# Patient Record
Sex: Female | Born: 1965 | Race: Black or African American | Hispanic: No | Marital: Single | Smoking: Never smoker
Health system: Southern US, Community
[De-identification: ages and names within clinical notes are randomized; demographics above are authoritative.]

## PROBLEM LIST (undated history)

## (undated) DIAGNOSIS — I2510 Atherosclerotic heart disease of native coronary artery without angina pectoris: Secondary | ICD-10-CM

## (undated) DIAGNOSIS — E669 Obesity, unspecified: Secondary | ICD-10-CM

## (undated) DIAGNOSIS — I10 Essential (primary) hypertension: Secondary | ICD-10-CM

## (undated) DIAGNOSIS — I700 Atherosclerosis of aorta: Secondary | ICD-10-CM

## (undated) DIAGNOSIS — N289 Disorder of kidney and ureter, unspecified: Secondary | ICD-10-CM

## (undated) DIAGNOSIS — I517 Cardiomegaly: Secondary | ICD-10-CM

## (undated) DIAGNOSIS — I3139 Other pericardial effusion (noninflammatory): Secondary | ICD-10-CM

## (undated) HISTORY — PX: ADRENAL GLAND SURGERY: SHX544

---

## 2019-10-24 ENCOUNTER — Emergency Department (HOSPITAL_BASED_OUTPATIENT_CLINIC_OR_DEPARTMENT_OTHER)
Admission: EM | Admit: 2019-10-24 | Discharge: 2019-10-24 | Disposition: A | Discharge status: Home / Self Care | Payer: 59 | Attending: Emergency Medicine | Admitting: Emergency Medicine

## 2019-10-24 ENCOUNTER — Emergency Department (HOSPITAL_BASED_OUTPATIENT_CLINIC_OR_DEPARTMENT_OTHER): Payer: 59

## 2019-10-24 ENCOUNTER — Other Ambulatory Visit: Payer: Self-pay

## 2019-10-24 ENCOUNTER — Encounter (HOSPITAL_BASED_OUTPATIENT_CLINIC_OR_DEPARTMENT_OTHER): Payer: Self-pay | Admitting: Emergency Medicine

## 2019-10-24 DIAGNOSIS — Y9389 Activity, other specified: Secondary | ICD-10-CM | POA: Diagnosis not present

## 2019-10-24 DIAGNOSIS — W01198A Fall on same level from slipping, tripping and stumbling with subsequent striking against other object, initial encounter: Secondary | ICD-10-CM | POA: Diagnosis not present

## 2019-10-24 DIAGNOSIS — S92532A Displaced fracture of distal phalanx of left lesser toe(s), initial encounter for closed fracture: Secondary | ICD-10-CM | POA: Insufficient documentation

## 2019-10-24 DIAGNOSIS — S99922A Unspecified injury of left foot, initial encounter: Secondary | ICD-10-CM | POA: Diagnosis present

## 2019-10-24 DIAGNOSIS — Y999 Unspecified external cause status: Secondary | ICD-10-CM | POA: Diagnosis not present

## 2019-10-24 DIAGNOSIS — Y9289 Other specified places as the place of occurrence of the external cause: Secondary | ICD-10-CM | POA: Diagnosis not present

## 2019-10-24 DIAGNOSIS — S92502A Displaced unspecified fracture of left lesser toe(s), initial encounter for closed fracture: Secondary | ICD-10-CM

## 2019-10-24 HISTORY — DX: Essential (primary) hypertension: I10

## 2019-10-24 HISTORY — DX: Disorder of kidney and ureter, unspecified: N28.9

## 2019-10-24 NOTE — ED Triage Notes (Signed)
States she tripped over a shoe on Sunday and has had pain to 3rd toe left foot since then. Bruising noted to toe

## 2019-10-24 NOTE — ED Provider Notes (Signed)
TIME SEEN: 6:29 AM  CHIEF COMPLAINT: left foot pain  HPI: Patient is a 54 year old female with history of hypertension who presents to the emergency department with complaints of left third toe pain and dorsal foot pain after she tripped 3 days ago.  Able to ambulate but has pain with walking.  No numbness or weakness.  No other injury.  Did not fall to the ground, strike her head or lose consciousness.  Has bruising and swelling to the left third toe that is extending into the dorsal midfoot.  ROS: See HPI Constitutional: no fever  Eyes: no drainage  ENT: no runny nose   Cardiovascular:  no chest pain  Resp: no SOB  GI: no vomiting GU: no dysuria Integumentary: no rash  Allergy: no hives  Musculoskeletal: no leg swelling  Neurological: no slurred speech ROS otherwise negative  PAST MEDICAL HISTORY/PAST SURGICAL HISTORY:  Past Medical History:  Diagnosis Date  . Hypertension   . Renal disorder     MEDICATIONS:  Prior to Admission medications   Not on File    ALLERGIES:  Allergies  Allergen Reactions  . Ibuprofen Other (See Comments)    Causes increase in BP    SOCIAL HISTORY:  Social History   Tobacco Use  . Smoking status: Never Smoker  . Smokeless tobacco: Never Used  Substance Use Topics  . Alcohol use: Not Currently    FAMILY HISTORY: No family history on file.  EXAM: BP (!) 168/106 (BP Location: Right Arm)   Pulse 92   Temp 98 F (36.7 C) (Oral)   Resp 18   Ht 5\' 4"  (1.626 m)   Wt 86.2 kg   LMP 10/13/2019   SpO2 97%   BMI 32.61 kg/m  CONSTITUTIONAL: Alert and responds appropriately to questions. Well-appearing; well-nourished HEAD: Normocephalic, atraumatic EYES: Conjunctivae clear, pupils appear equal ENT: normal nose; moist mucous membranes NECK: Normal range of motion CARD: Regular rate and rhythm RESP: Normal chest excursion without splinting or tachypnea; no hypoxia or respiratory distress, speaking full sentences ABD/GI:  non-distended EXT: Normal ROM in all joints, tender to palpation over the third left toe with associated soft tissue swelling and ecchymosis; tender over the dorsal left midfoot without deformity, swelling or ecchymosis; 2+ left DP pulse, no tenderness over the left ankle, no calf tenderness or calf swelling, compartments in the left leg are soft, normal sensation in the left lower extremity SKIN: Normal color for age and race, no rashes on exposed skin NEURO: Moves all extremities equally, normal speech, no facial asymmetry noted PSYCH: The patient's mood and manner are appropriate. Grooming and personal hygiene are appropriate.  MEDICAL DECISION MAKING: Patient here with left foot injury.  Seems mostly isolated to the left third toe but she does have some tenderness extending into the midfoot.  Will obtain x-rays.  She declines any Tylenol, ibuprofen at this time.  No other injury on exam.  Neurovascularly intact distally.  ED PROGRESS: X-ray reviewed/interpreted by myself and appears to show a distal third phalanx fracture.  Will provide postop shoe for comfort but discussed with patient she can weight-bear as tolerated.  Recommend alternating Tylenol and ibuprofen for pain.  Recommended ice, elevation for pain, swelling and bruising.  No other injury seen on x-ray.   At this time, I do not feel there is any life-threatening condition present. I have reviewed, interpreted and discussed all results (EKG, imaging, lab, urine as appropriate) and exam findings with patient/family. I have reviewed nursing notes and appropriate  previous records.  I feel the patient is safe to be discharged home without further emergent workup and can continue workup as an outpatient as needed. Discussed usual and customary return precautions. Patient/family verbalize understanding and are comfortable with this plan.  Outpatient follow-up has been provided as needed. All questions have been answered.  Tracy Decker was  evaluated in Emergency Department on 10/24/2019 for the symptoms described in the history of present illness. She was evaluated in the context of the global COVID-19 pandemic, which necessitated consideration that the patient might be at risk for infection with the SARS-CoV-2 virus that causes COVID-19. Institutional protocols and algorithms that pertain to the evaluation of patients at risk for COVID-19 are in a state of rapid change based on information released by regulatory bodies including the CDC and federal and state organizations. These policies and algorithms were followed during the patient's care in the ED.      Tracy Decker, Tracy Bison, DO 10/24/19 [phone]

## 2019-10-24 NOTE — Discharge Instructions (Addendum)
You may alternate Tylenol 1000 mg every 6 hours as needed for pain and Ibuprofen 800 mg every 8 hours as needed for pain.  Please take Ibuprofen with food.  Do not take more than 4000 mg of Tylenol (acetaminophen) in a 24 hour period.     Steps to find a Primary Care Provider (PCP):  Call [phone] or [phone] to access "[facility] Find a Doctor Service."  2.  You may also go on the [facility] website at [email or web address]  3.  [facility] also frequently accepts new patients.  [facility]  [address] [place] [address] [phone]  4.  There are also multiple [facility], [facility] and [facility] practices throughout the [place] that are frequently accepting new patients. You may find a clinic that is close to your home and contact them.  [email or web address] [phone]  [email or web address]  [email or web address] [phone]  [email or web address] [phone]  5.  Local Health Departments also can provide primary care services.  [facility]  [address] [place] [address] [phone]  [facility] [address] [place] [address] [phone]  [facility] [address]  [place] [address] [phone]   

## 2019-10-25 ENCOUNTER — Telehealth (HOSPITAL_BASED_OUTPATIENT_CLINIC_OR_DEPARTMENT_OTHER): Payer: Self-pay | Admitting: Emergency Medicine

## 2019-11-05 ENCOUNTER — Encounter: Payer: Self-pay | Admitting: Family Medicine

## 2019-11-05 ENCOUNTER — Other Ambulatory Visit: Payer: Self-pay

## 2019-11-05 ENCOUNTER — Ambulatory Visit (INDEPENDENT_AMBULATORY_CARE_PROVIDER_SITE_OTHER): Payer: 59 | Admitting: Family Medicine

## 2019-11-05 VITALS — BP 181/112 | HR 89 | Ht 64.0 in | Wt 190.0 lb

## 2019-11-05 DIAGNOSIS — S92532A Displaced fracture of distal phalanx of left lesser toe(s), initial encounter for closed fracture: Secondary | ICD-10-CM | POA: Diagnosis not present

## 2019-11-05 HISTORY — DX: Displaced fracture of distal phalanx of left lesser toe(s), initial encounter for closed fracture: S92.532A

## 2019-11-05 NOTE — Assessment & Plan Note (Signed)
Initial injury on 5/2. Swelling and pain have improved with post op shoe  -Counseled on buddy taping and supportive care. -Try to transition out of the postop shoe. -Follow-up as needed.

## 2019-11-05 NOTE — Addendum Note (Signed)
Addended by: Rosemarie Ax on: 11/05/2019 12:37 PM   Modules accepted: Level of Service

## 2019-11-05 NOTE — Patient Instructions (Signed)
Nice to meet you Please use ice as needed  Please elevate when possible  Please buddy tape until you are pain free  Please send me a message in [facility] with any questions or updates.  Please see [place] back as needed.   --Dr. Raeford Razor

## 2019-11-05 NOTE — Progress Notes (Signed)
  Tracy Decker - 54 y.o. female MRN [id number]  Date of birth: Apr 21, 1966  SUBJECTIVE:  Including CC & ROS.  Chief Complaint  Patient presents with  . Toe Injury    left 3rd toe    Tracy Decker is a 54 y.o. female that is presenting with left toe pain.  She tripped over a shoe.  She was seen in emergency department and x-ray showed fracture.  She has been using a postop shoe since that time.  Swelling and bruising have gone down..  Independent review of the left foot x-ray from 5/5 shows newly displaced fracture of the volar base of the third distal phalanx    Review of Systems See HPI   HISTORY: Past Medical, Surgical, Social, and Family History Reviewed & Updated per EMR.   Pertinent Historical Findings include:  Past Medical History:  Diagnosis Date  . Hypertension   . Renal disorder     Past Surgical History:  Procedure Laterality Date  . ADRENAL GLAND SURGERY Bilateral     No family history on file.  Social History   Socioeconomic History  . Marital status: Single    Spouse name: Not on file  . Number of children: Not on file  . Years of education: Not on file  . Highest education level: Not on file  Occupational History  . Not on file  Tobacco Use  . Smoking status: Never Smoker  . Smokeless tobacco: Never Used  Substance and Sexual Activity  . Alcohol use: Not Currently  . Drug use: Never  . Sexual activity: Not on file  Other Topics Concern  . Not on file  Social History Narrative  . Not on file   Social Determinants of Health   Financial Resource Strain:   . Difficulty of Paying Living Expenses:   Food Insecurity:   . Worried About Charity fundraiser in the Last Year:   . Arboriculturist in the Last Year:   Transportation Needs:   . Film/video editor (Medical):   Marland Kitchen Lack of Transportation (Non-Medical):   Physical Activity:   . Days of Exercise per Week:   . Minutes of Exercise per Session:   Stress:   . Feeling of Stress :     Social Connections:   . Frequency of Communication with Friends and Family:   . Frequency of Social Gatherings with Friends and Family:   . Attends Religious Services:   . Active Member of Clubs or Organizations:   . Attends Archivist Meetings:   Marland Kitchen Marital Status:   Intimate Partner Violence:   . Fear of Current or Ex-Partner:   . Emotionally Abused:   Marland Kitchen Physically Abused:   . Sexually Abused:      PHYSICAL EXAM:  VS: BP (!) 181/112   Pulse 89   Ht 5\' 4"  (1.626 m)   Wt 190 lb (86.2 kg)   LMP 10/13/2019   BMI 32.61 kg/m  Physical Exam Gen: NAD, alert, cooperative with exam, well-appearing MSK:  Left foot: No ecchymosis. Tenderness to palpation over the distal phalanx of the third phalange. Mild swelling of the third digit. Neurovascularly intact     ASSESSMENT & PLAN:   Closed displaced fracture of distal phalanx of lesser toe of left foot Initial injury on 5/2. Swelling and pain have improved with post op shoe  -Counseled on buddy taping and supportive care. -Try to transition out of the postop shoe. -Follow-up as needed.

## 2020-04-16 ENCOUNTER — Encounter: Payer: Self-pay | Admitting: Family Medicine

## 2020-04-16 ENCOUNTER — Other Ambulatory Visit: Payer: Self-pay

## 2020-04-16 ENCOUNTER — Ambulatory Visit (INDEPENDENT_AMBULATORY_CARE_PROVIDER_SITE_OTHER): Payer: 59 | Admitting: Family Medicine

## 2020-04-16 VITALS — BP 163/97 | HR 71 | Ht 64.0 in | Wt 201.1 lb

## 2020-04-16 DIAGNOSIS — N939 Abnormal uterine and vaginal bleeding, unspecified: Secondary | ICD-10-CM | POA: Diagnosis not present

## 2020-04-16 DIAGNOSIS — D219 Benign neoplasm of connective and other soft tissue, unspecified: Secondary | ICD-10-CM

## 2020-04-16 MED ORDER — MEGESTROL ACETATE 40 MG PO TABS: 40.0000 mg | ORAL_TABLET | Freq: Every day | ORAL | 6 refills | Status: DC

## 2020-04-16 NOTE — Progress Notes (Signed)
Last Pap: 2019 ([place])

## 2020-04-16 NOTE — Progress Notes (Signed)
   Subjective:    Patient ID: Tracy Decker, female    DOB: Apr 28, 1966, 54 y.o.   MRN: [id number]  HPI Patient is a G1P1 here for AUB. She has been having irregular bleeding, occasionally misses a period. Having some episodes of prolonged bleeding. Last period was 9/23 and had continued spotting/bleeding, along with foul odor for about 3 weeks, then stopped. Currently no bleeding.   Has history of depression/anxiety and has been on lexapro and wellbutrin. Is not currently taken them.   She has a history of myomectomy several years ago, due to large fibroids. She reports having an endometrial biopsy in the past.  I have reviewed the patients past medical, family, and social history.  I have reviewed the patient's medication list and allergies.  Review of Systems     Objective:   Physical Exam Vitals reviewed.  Cardiovascular:     Rate and Rhythm: Normal rate and regular rhythm.     Pulses: Normal pulses.     Heart sounds: Normal heart sounds.  Pulmonary:     Effort: Pulmonary effort is normal.     Breath sounds: Normal breath sounds.  Abdominal:     General: Abdomen is flat. There is no distension.     Palpations: Abdomen is soft. There is no mass.     Tenderness: There is no abdominal tenderness. There is no guarding or rebound.     Hernia: No hernia is present.  Skin:    General: Skin is warm.     Capillary Refill: Capillary refill takes less than 2 seconds.  Psychiatric:        Mood and Affect: Mood normal.        Thought Content: Thought content normal.        Judgment: Judgment normal.       Assessment & Plan:  1. Abnormal uterine bleeding (AUB) Check TSH and [place]. Discussed medical vs surgical options - would prefer medical options to start. Will start with oral progesterone as patient declined IUD. Start megace 40mg  daily. Discussed potential side effects. F/u in 2-3 months. - TSH - US PELVIS TRANSVAGINAL NON-OB (TV ONLY); Future

## 2020-04-17 LAB — TSH: TSH: 1.27 u[IU]/mL (ref 0.450–4.500)

## 2020-04-24 ENCOUNTER — Ambulatory Visit (HOSPITAL_BASED_OUTPATIENT_CLINIC_OR_DEPARTMENT_OTHER)
Admission: RE | Admit: 2020-04-24 | Discharge: 2020-04-24 | Disposition: A | Discharge status: Home / Self Care | Payer: 59 | Source: Ambulatory Visit | Attending: Family Medicine | Admitting: Family Medicine

## 2020-04-24 ENCOUNTER — Other Ambulatory Visit: Payer: Self-pay

## 2020-04-24 DIAGNOSIS — N939 Abnormal uterine and vaginal bleeding, unspecified: Secondary | ICD-10-CM | POA: Diagnosis not present

## 2020-05-22 MED ORDER — MEGESTROL ACETATE 40 MG PO TABS
40.0000 mg | ORAL_TABLET | Freq: Two times a day (BID) | ORAL | 3 refills | Status: DC
Start: 2020-05-22 — End: 2020-08-14

## 2020-07-15 ENCOUNTER — Inpatient Hospital Stay (HOSPITAL_COMMUNITY): Payer: No Typology Code available for payment source

## 2020-07-15 ENCOUNTER — Encounter (HOSPITAL_COMMUNITY): Payer: Self-pay | Admitting: Emergency Medicine

## 2020-07-15 ENCOUNTER — Emergency Department (HOSPITAL_COMMUNITY): Payer: No Typology Code available for payment source

## 2020-07-15 ENCOUNTER — Inpatient Hospital Stay (HOSPITAL_COMMUNITY)
Admission: EM | Admit: 2020-07-15 | Discharge: 2020-07-17 | DRG: 313 | Disposition: A | Discharge status: Home / Self Care | Payer: No Typology Code available for payment source | Attending: Internal Medicine | Admitting: Internal Medicine

## 2020-07-15 DIAGNOSIS — I313 Pericardial effusion (noninflammatory): Secondary | ICD-10-CM | POA: Diagnosis present

## 2020-07-15 DIAGNOSIS — R002 Palpitations: Secondary | ICD-10-CM | POA: Diagnosis present

## 2020-07-15 DIAGNOSIS — K219 Gastro-esophageal reflux disease without esophagitis: Secondary | ICD-10-CM | POA: Diagnosis present

## 2020-07-15 DIAGNOSIS — D72829 Elevated white blood cell count, unspecified: Secondary | ICD-10-CM | POA: Diagnosis present

## 2020-07-15 DIAGNOSIS — R6884 Jaw pain: Secondary | ICD-10-CM | POA: Diagnosis present

## 2020-07-15 DIAGNOSIS — E872 Acidosis, unspecified: Secondary | ICD-10-CM

## 2020-07-15 DIAGNOSIS — R000 Tachycardia, unspecified: Secondary | ICD-10-CM | POA: Diagnosis present

## 2020-07-15 DIAGNOSIS — I10 Essential (primary) hypertension: Secondary | ICD-10-CM

## 2020-07-15 DIAGNOSIS — U071 COVID-19: Secondary | ICD-10-CM | POA: Diagnosis present

## 2020-07-15 DIAGNOSIS — M25512 Pain in left shoulder: Secondary | ICD-10-CM | POA: Diagnosis present

## 2020-07-15 DIAGNOSIS — R079 Chest pain, unspecified: Secondary | ICD-10-CM

## 2020-07-15 DIAGNOSIS — E669 Obesity, unspecified: Secondary | ICD-10-CM | POA: Diagnosis present

## 2020-07-15 DIAGNOSIS — Z886 Allergy status to analgesic agent status: Secondary | ICD-10-CM

## 2020-07-15 DIAGNOSIS — R778 Other specified abnormalities of plasma proteins: Secondary | ICD-10-CM

## 2020-07-15 DIAGNOSIS — Z79899 Other long term (current) drug therapy: Secondary | ICD-10-CM | POA: Diagnosis not present

## 2020-07-15 DIAGNOSIS — R0789 Other chest pain: Secondary | ICD-10-CM | POA: Diagnosis present

## 2020-07-15 DIAGNOSIS — Z8616 Personal history of COVID-19: Secondary | ICD-10-CM | POA: Diagnosis not present

## 2020-07-15 DIAGNOSIS — M79602 Pain in left arm: Secondary | ICD-10-CM | POA: Diagnosis present

## 2020-07-15 DIAGNOSIS — Z803 Family history of malignant neoplasm of breast: Secondary | ICD-10-CM

## 2020-07-15 DIAGNOSIS — N289 Disorder of kidney and ureter, unspecified: Secondary | ICD-10-CM | POA: Diagnosis present

## 2020-07-15 DIAGNOSIS — I214 Non-ST elevation (NSTEMI) myocardial infarction: Secondary | ICD-10-CM

## 2020-07-15 DIAGNOSIS — Z6834 Body mass index (BMI) 34.0-34.9, adult: Secondary | ICD-10-CM | POA: Diagnosis not present

## 2020-07-15 HISTORY — DX: Personal history of COVID-19: Z86.16

## 2020-07-15 LAB — CBC WITH DIFFERENTIAL/PLATELET
Abs Immature Granulocytes: 0.03 10*3/uL (ref 0.00–0.07)
Basophils Absolute: 0 10*3/uL (ref 0.0–0.1)
Basophils Relative: 0 %
Eosinophils Absolute: 0.1 10*3/uL (ref 0.0–0.5)
Eosinophils Relative: 1 %
HCT: 40.5 % (ref 36.0–46.0)
Hemoglobin: 13.6 g/dL (ref 12.0–15.0)
Immature Granulocytes: 0 %
Lymphocytes Relative: 31 %
Lymphs Abs: 3.6 10*3/uL (ref 0.7–4.0)
MCH: 30.6 pg (ref 26.0–34.0)
MCHC: 33.6 g/dL (ref 30.0–36.0)
MCV: 91.2 fL (ref 80.0–100.0)
Monocytes Absolute: 1.2 10*3/uL — ABNORMAL HIGH (ref 0.1–1.0)
Monocytes Relative: 10 %
Neutro Abs: 6.6 10*3/uL (ref 1.7–7.7)
Neutrophils Relative %: 58 %
Platelets: 344 10*3/uL (ref 150–400)
RBC: 4.44 MIL/uL (ref 3.87–5.11)
RDW: 13.4 % (ref 11.5–15.5)
WBC: 11.5 10*3/uL — ABNORMAL HIGH (ref 4.0–10.5)
nRBC: 0 % (ref 0.0–0.2)

## 2020-07-15 LAB — LIPID PANEL
Cholesterol: 183 mg/dL (ref 0–200)
HDL: 35 mg/dL — ABNORMAL LOW (ref >40)
LDL Cholesterol: 120 mg/dL — ABNORMAL HIGH (ref 0–99)
Total CHOL/HDL Ratio: 5.2 RATIO
Triglycerides: 141 mg/dL (ref <150)
VLDL: 28 mg/dL (ref 0–40)

## 2020-07-15 LAB — ECHOCARDIOGRAM LIMITED
Area-P 1/2: 4.06 cm2
Height: 64 in
S' Lateral: 2.3 cm
Weight: 3200 oz

## 2020-07-15 LAB — BASIC METABOLIC PANEL
Anion gap: 11 (ref 5–15)
BUN: 14 mg/dL (ref 6–20)
CO2: 19 mmol/L — ABNORMAL LOW (ref 22–32)
Calcium: 9.1 mg/dL (ref 8.9–10.3)
Chloride: 108 mmol/L (ref 98–111)
Creatinine, Ser: 0.96 mg/dL (ref 0.44–1.00)
GFR, Estimated: >60 mL/min (ref >60)
Glucose, Bld: 166 mg/dL — ABNORMAL HIGH (ref 70–99)
Potassium: 4.2 mmol/L (ref 3.5–5.1)
Sodium: 138 mmol/L (ref 135–145)

## 2020-07-15 LAB — COMPREHENSIVE METABOLIC PANEL WITH GFR
ALT: 13 U/L (ref 0–44)
AST: 13 U/L — ABNORMAL LOW (ref 15–41)
Albumin: 3.6 g/dL (ref 3.5–5.0)
Alkaline Phosphatase: 51 U/L (ref 38–126)
Anion gap: 13 (ref 5–15)
BUN: 17 mg/dL (ref 6–20)
CO2: 17 mmol/L — ABNORMAL LOW (ref 22–32)
Calcium: 8.9 mg/dL (ref 8.9–10.3)
Chloride: 109 mmol/L (ref 98–111)
Creatinine, Ser: 1 mg/dL (ref 0.44–1.00)
GFR, Estimated: >60 mL/min
Glucose, Bld: 114 mg/dL — ABNORMAL HIGH (ref 70–99)
Potassium: 3.9 mmol/L (ref 3.5–5.1)
Sodium: 139 mmol/L (ref 135–145)
Total Bilirubin: 0.8 mg/dL (ref 0.3–1.2)
Total Protein: 6.9 g/dL (ref 6.5–8.1)

## 2020-07-15 LAB — I-STAT CHEM 8, ED
BUN: 19 mg/dL (ref 6–20)
Calcium, Ion: 1.17 mmol/L (ref 1.15–1.40)
Chloride: 110 mmol/L (ref 98–111)
Creatinine, Ser: 0.8 mg/dL (ref 0.44–1.00)
Glucose, Bld: 112 mg/dL — ABNORMAL HIGH (ref 70–99)
HCT: 42 % (ref 36.0–46.0)
Hemoglobin: 14.3 g/dL (ref 12.0–15.0)
Potassium: 3.9 mmol/L (ref 3.5–5.1)
Sodium: 142 mmol/L (ref 135–145)
TCO2: 19 mmol/L — ABNORMAL LOW (ref 22–32)

## 2020-07-15 LAB — HEPATIC FUNCTION PANEL
ALT: 12 U/L (ref 0–44)
AST: 19 U/L (ref 15–41)
Albumin: 3.5 g/dL (ref 3.5–5.0)
Alkaline Phosphatase: 57 U/L (ref 38–126)
Bilirubin, Direct: 0.2 mg/dL (ref 0.0–0.2)
Indirect Bilirubin: 0.9 mg/dL (ref 0.3–0.9)
Total Bilirubin: 1.1 mg/dL (ref 0.3–1.2)
Total Protein: 6.6 g/dL (ref 6.5–8.1)

## 2020-07-15 LAB — HEPARIN LEVEL (UNFRACTIONATED)
Heparin Unfractionated: >2.2 IU/mL — ABNORMAL HIGH (ref 0.30–0.70)
Heparin Unfractionated: 0.4 IU/mL (ref 0.30–0.70)
Heparin Unfractionated: 0.4 IU/mL (ref 0.30–0.70)

## 2020-07-15 LAB — CBC
HCT: 39 % (ref 36.0–46.0)
Hemoglobin: 13 g/dL (ref 12.0–15.0)
MCH: 30.6 pg (ref 26.0–34.0)
MCHC: 33.3 g/dL (ref 30.0–36.0)
MCV: 91.8 fL (ref 80.0–100.0)
Platelets: 327 10*3/uL (ref 150–400)
RBC: 4.25 MIL/uL (ref 3.87–5.11)
RDW: 13.5 % (ref 11.5–15.5)
WBC: 10.6 10*3/uL — ABNORMAL HIGH (ref 4.0–10.5)
nRBC: 0 % (ref 0.0–0.2)

## 2020-07-15 LAB — D-DIMER, QUANTITATIVE: D-Dimer, Quant: <0.27 ug/mL-FEU (ref 0.00–0.50)

## 2020-07-15 LAB — TROPONIN I (HIGH SENSITIVITY)
Troponin I (High Sensitivity): 104 ng/L
Troponin I (High Sensitivity): 122 ng/L (ref <18)
Troponin I (High Sensitivity): 153 ng/L (ref <18)
Troponin I (High Sensitivity): 156 ng/L (ref <18)
Troponin I (High Sensitivity): 149 ng/L (ref <18)

## 2020-07-15 LAB — TSH: TSH: 1.994 u[IU]/mL (ref 0.350–4.500)

## 2020-07-15 LAB — SARS CORONAVIRUS 2 BY RT PCR (HOSPITAL ORDER, PERFORMED IN ~~LOC~~ HOSPITAL LAB): SARS Coronavirus 2: POSITIVE — AB

## 2020-07-15 LAB — I-STAT BETA HCG BLOOD, ED (MC, WL, AP ONLY): I-stat hCG, quantitative: <5 m[IU]/mL

## 2020-07-15 LAB — SEDIMENTATION RATE: Sed Rate: 33 mm/hr — ABNORMAL HIGH (ref 0–22)

## 2020-07-15 LAB — HIV ANTIBODY (ROUTINE TESTING W REFLEX): HIV Screen 4th Generation wRfx: NONREACTIVE

## 2020-07-15 LAB — C-REACTIVE PROTEIN: CRP: <0.5 mg/dL (ref <1.0)

## 2020-07-15 MED ORDER — ONDANSETRON HCL 4 MG/2ML IJ SOLN
4.0000 mg | Freq: Once | INTRAMUSCULAR | Status: AC
  Administered 2020-07-15: 4 mg via INTRAVENOUS
  Filled 2020-07-15: qty 2

## 2020-07-15 MED ORDER — HEPARIN BOLUS VIA INFUSION
4000.0000 [IU] | Freq: Once | INTRAVENOUS | Status: AC
  Administered 2020-07-15: 4000 [IU] via INTRAVENOUS
  Filled 2020-07-15: qty 4000

## 2020-07-15 MED ORDER — LISINOPRIL 20 MG PO TABS
40.0000 mg | ORAL_TABLET | Freq: Every day | ORAL | Status: DC
Start: 2020-07-15 — End: 2020-07-17
  Administered 2020-07-15 – 2020-07-17 (×3): 40 mg via ORAL
  Filled 2020-07-15 (×3): qty 2

## 2020-07-15 MED ORDER — ACETAMINOPHEN 325 MG PO TABS
650.0000 mg | ORAL_TABLET | ORAL | Status: DC | PRN
  Administered 2020-07-15 (×3): 650 mg via ORAL
  Filled 2020-07-15 (×3): qty 2

## 2020-07-15 MED ORDER — IOHEXOL 350 MG/ML SOLN
100.0000 mL | Freq: Once | INTRAVENOUS | Status: AC | PRN
  Administered 2020-07-15: 100 mL via INTRAVENOUS

## 2020-07-15 MED ORDER — ROSUVASTATIN CALCIUM 20 MG PO TABS
40.0000 mg | ORAL_TABLET | Freq: Every day | ORAL | Status: DC
  Administered 2020-07-15 – 2020-07-17 (×3): 40 mg via ORAL
  Filled 2020-07-15 (×3): qty 2

## 2020-07-15 MED ORDER — ONDANSETRON HCL 4 MG/2ML IJ SOLN: 4.0000 mg | Freq: Four times a day (QID) | INTRAMUSCULAR | Status: DC | PRN

## 2020-07-15 MED ORDER — METOPROLOL TARTRATE 12.5 MG HALF TABLET
12.5000 mg | ORAL_TABLET | Freq: Two times a day (BID) | ORAL | Status: DC
  Administered 2020-07-15 (×2): 12.5 mg via ORAL
  Filled 2020-07-15 (×3): qty 1

## 2020-07-15 MED ORDER — MORPHINE SULFATE (PF) 2 MG/ML IV SOLN
1.0000 mg | INTRAVENOUS | Status: DC | PRN
  Administered 2020-07-15: 1 mg via INTRAVENOUS
  Filled 2020-07-15: qty 1

## 2020-07-15 MED ORDER — ASPIRIN EC 81 MG PO TBEC
81.0000 mg | DELAYED_RELEASE_TABLET | Freq: Every day | ORAL | Status: DC
  Administered 2020-07-16 – 2020-07-17 (×2): 81 mg via ORAL
  Filled 2020-07-15 (×2): qty 1

## 2020-07-15 MED ORDER — MEGESTROL ACETATE 40 MG PO TABS
40.0000 mg | ORAL_TABLET | Freq: Two times a day (BID) | ORAL | Status: DC
  Administered 2020-07-15 – 2020-07-17 (×5): 40 mg via ORAL
  Filled 2020-07-15 (×7): qty 1

## 2020-07-15 MED ORDER — HEPARIN (PORCINE) 25000 UT/250ML-% IV SOLN
1100.0000 [IU]/h | INTRAVENOUS | Status: DC
  Administered 2020-07-15 (×2): 1100 [IU]/h via INTRAVENOUS
  Filled 2020-07-15 (×3): qty 250

## 2020-07-15 MED ORDER — SODIUM CHLORIDE 0.9 % IV SOLN: INTRAVENOUS | Status: DC

## 2020-07-15 MED ORDER — ASPIRIN 325 MG PO TABS
325.0000 mg | ORAL_TABLET | Freq: Every day | ORAL | Status: DC
  Administered 2020-07-15: 325 mg via ORAL
  Filled 2020-07-15: qty 1

## 2020-07-15 MED ORDER — SODIUM CHLORIDE 0.9 % IV BOLUS
500.0000 mL | Freq: Once | INTRAVENOUS | Status: AC
  Administered 2020-07-15: 500 mL via INTRAVENOUS

## 2020-07-15 MED ORDER — NITROGLYCERIN IN D5W 200-5 MCG/ML-% IV SOLN
0.0000 ug/min | INTRAVENOUS | Status: DC
  Administered 2020-07-15: 30 ug/min via INTRAVENOUS
  Administered 2020-07-15: 15 ug/min via INTRAVENOUS
  Administered 2020-07-15: 40 ug/min via INTRAVENOUS
  Administered 2020-07-15: 7 ug/min via INTRAVENOUS
  Administered 2020-07-15: 10 ug/min via INTRAVENOUS
  Administered 2020-07-15: 20 ug/min via INTRAVENOUS
  Administered 2020-07-15: 25 ug/min via INTRAVENOUS
  Administered 2020-07-15: 10 ug/min via INTRAVENOUS
  Administered 2020-07-15: 15 ug/min via INTRAVENOUS
  Filled 2020-07-15 (×2): qty 250

## 2020-07-15 NOTE — Progress Notes (Signed)
Brief cardiology update: Echo without wall motion abnormalities, small effusion. Inflammatory markers not significantly elevated. Called into patient's room to discussion next steps in evaluation. Discussed risk/benefit, will plan for CT cardiac (time TBD, likely tomorrow AM). She is already on metoprolol and nitroglycerin. She is amenable, understands risk of radiation and contrast exposure (recent CTPE noted). Orders placed. Buford Dresser, MD, PhD, [facility]  [address], [place], [place] [address] [phone]

## 2020-07-15 NOTE — Consult Note (Signed)
[facility] Consult Note   Primary Physician: [name], NP Primary Cardiologist:  None listed  Reason for Consultation: Chest pain  HPI:    Tracy Decker is a 55 year old African-American female with a past medical history significant for recent COVID-19 infection (10 days ago - with minor symptoms), hypertension, and obesity, who presents to the hospital with complaints of acute onset of chest pain earlier in the evening.  Patient reported that the chest pain woke her up from sleep.  It was left-sided with left arm and jaw pain.  It was 9 out of 10 in intensity.  She also reported a subjective sensation of her heart beating fast.  There was no nausea, vomiting or  diaphoresis.  EMS treated her with 500 cc of normal saline, aspirin, sublingual nitroglycerin and 8 mg of morphine.  In the ED the chest pain was much improved.  She was still noted to be tachycardic in the 110s.  High-sensitivity troponins were 104 and 122.  Electrocardiogram showed sinus tachycardia, rate 118 bpm, voltage criteria for LVH and ST-T abnormalities in the inferolateral leads  Chest x-ray showing no active disease. CT angiogram chest negative for PE.  Showing moderate LVH.  Tiny focus of subpleural groundglass pulmonary infiltrate within the right lower lobe (nonspecific).    Home Medications Prior to Admission medications   Medication Sig Start Date End Date Taking? Authorizing Provider  amLODipine (NORVASC) 10 MG tablet Take 10 mg by mouth daily.   Yes [provider]  Cholecalciferol (VITAMIN D3) 50 MCG (2000 UT) CAPS Take 2,000 Units by mouth daily.   Yes [provider]  Fluocinolone Acetonide Scalp 0.01 % OIL Apply 1 application topically daily as needed for irritation. 02/01/20  Yes [provider]  hydrochlorothiazide (MICROZIDE) 12.5 MG capsule Take 12.5 mg by mouth daily. 06/16/20  Yes [provider]  lisinopril (ZESTRIL) 40 MG tablet Take 40 mg by mouth  daily.   Yes [provider]  megestrol (MEGACE) 40 MG tablet Take 1 tablet (40 mg total) by mouth 2 (two) times daily. Can increase to two tablets twice a day in the event of heavy bleeding 05/22/20  Yes Truett Mainland, DO  metoprolol tartrate (LOPRESSOR) 25 MG tablet Take 25 mg by mouth at bedtime.   Yes [provider]  minoxidil (LONITEN) 2.5 MG tablet Take 5 mg by mouth daily.   Yes [provider]  spironolactone (ALDACTONE) 50 MG tablet Take 50 mg by mouth daily.   Yes [provider]  zolpidem (AMBIEN) 10 MG tablet Take 10 mg by mouth at bedtime as needed for sleep. 07/09/20  Yes [provider]    Past Medical History: Past Medical History:  Diagnosis Date  . Hypertension   . Renal disorder     Past Surgical History: Past Surgical History:  Procedure Laterality Date  . ADRENAL GLAND SURGERY Bilateral     Family History: Family History  Problem Relation Age of Onset  . Breast cancer Maternal Grandmother     Social History: Social History   Socioeconomic History  . Marital status: Single    Spouse name: Not on file  . Number of children: Not on file  . Years of education: Not on file  . Highest education level: Not on file  Occupational History  . Not on file  Tobacco Use  . Smoking status: Never Smoker  . Smokeless tobacco: Never Used  Substance and Sexual Activity  . Alcohol use: Not  Currently  . Drug use: Never  . Sexual activity: Not Currently    Birth control/protection: None  Other Topics Concern  . Not on file  Social History Narrative  . Not on file   Social Determinants of Health   Financial Resource Strain: Not on file  Food Insecurity: Not on file  Transportation Needs: Not on file  Physical Activity: Not on file  Stress: Not on file  Social Connections: Not on file    Allergies:  Allergies  Allergen Reactions  . Ibuprofen Other (See Comments)    Causes increase in BP     Review of  Systems: [y] = yes, [ ]  = no   . General: Weight gain [ ] ; Weight loss [ ] ; Anorexia [ ] ; Fatigue [ ] ; Fever [ ] ; Chills [ ] ; Weakness [ ]   . Cardiac: Chest pain/pressure [Y]; Resting SOB [ ] ; Exertional SOB [ ] ; Orthopnea [ ] ; Pedal Edema [ ] ; Palpitations [Y]; Syncope [ ] ; Presyncope [ ] ; Paroxysmal nocturnal dyspnea[ ]   . Pulmonary: Cough [ ] ; Wheezing[ ] ; Hemoptysis[ ] ; Sputum [ ] ; Snoring [ ]   . GI: Vomiting[ ] ; Dysphagia[ ] ; Melena[ ] ; Hematochezia [ ] ; Heartburn[ ] ; Abdominal pain [ ] ; Constipation [ ] ; Diarrhea [ ] ; BRBPR [ ]   . GU: Hematuria[ ] ; Dysuria [ ] ; Nocturia[ ]   . Vascular: Pain in legs with walking [ ] ; Pain in feet with lying flat [ ] ; Non-healing sores [ ] ; Stroke [ ] ; TIA [ ] ; Slurred speech [ ] ;  . Neuro: Headaches[ ] ; Vertigo[ ] ; Seizures[ ] ; Paresthesias[ ] ;Blurred vision [ ] ; Diplopia [ ] ; Vision changes [ ]   . Ortho/Skin: Arthritis [ ] ; Joint pain [ ] ; Muscle pain [ ] ; Joint swelling [ ] ; Back Pain [ ] ; Rash [ ]   . Psych: Depression[ ] ; Anxiety[ ]   . Heme: Bleeding problems [ ] ; Clotting disorders [ ] ; Anemia [ ]   . Endocrine: Diabetes [ ] ; Thyroid dysfunction[ ]      Objective:    Vital Signs:   Temp:  [98.1 F (36.7 C)] 98.1 F (36.7 C) (01/25 0005) Pulse Rate:  [110-125] 110 (01/25 0045) Resp:  [12-18] 17 (01/25 0045) BP: (139-147)/(88-101) 139/88 (01/25 0045) SpO2:  [97 %-98 %] 97 % (01/25 0045) Weight:  [90.7 kg] 90.7 kg (01/25 0010)    Weight change: Filed Weights   07/15/20 0010  Weight: 90.7 kg    Intake/Output:  No intake or output data in the 24 hours ending 07/15/20 0314    Physical Exam    General:  Well appearing. No resp difficulty HEENT: normal Neck: supple. JVP . Carotids 2+ bilat; no bruits. No lymphadenopathy or thyromegaly appreciated. Cor: Tachycardic, no murmurs Lungs: clear Abdomen: soft, nontender, nondistended. No hepatosplenomegaly. No bruits or masses. Good bowel sounds. Extremities: no cyanosis, clubbing, rash,  edema Neuro: alert & orientedx3, cranial nerves grossly intact. moves all 4 extremities w/o difficulty. Affect pleasant    Labs   Basic Metabolic Panel: Recent Labs  Lab 07/15/20 0020 07/15/20 0026  NA 139 142  K 3.9 3.9  CL 109 110  CO2 17*  --   GLUCOSE 114* 112*  BUN 17 19  CREATININE 1.00 0.80  CALCIUM 8.9  --     Liver Function Tests: Recent Labs  Lab 07/15/20 0020  AST 13*  ALT 13  ALKPHOS 51  BILITOT 0.8  PROT 6.9  ALBUMIN 3.6   No results for input(s): LIPASE, AMYLASE in the last 168 hours. No results for  input(s): AMMONIA in the last 168 hours.  CBC: Recent Labs  Lab 07/15/20 0020 07/15/20 0026  WBC 11.5*  --   NEUTROABS 6.6  --   HGB 13.6 14.3  HCT 40.5 42.0  MCV 91.2  --   PLT 344  --     Cardiac Enzymes: No results for input(s): CKTOTAL, CKMB, CKMBINDEX, TROPONINI in the last 168 hours.  BNP: BNP (last 3 results) No results for input(s): BNP in the last 8760 hours.  ProBNP (last 3 results) No results for input(s): PROBNP in the last 8760 hours.   CBG: No results for input(s): GLUCAP in the last 168 hours.  Coagulation Studies: No results for input(s): LABPROT, INR in the last 72 hours.   Imaging   CT Angio Chest PE W and/or Wo Contrast  Result Date: 07/15/2020 CLINICAL DATA:  Chest pain, dyspnea EXAM: CT ANGIOGRAPHY CHEST WITH CONTRAST TECHNIQUE: Multidetector CT imaging of the chest was performed using the standard protocol during bolus administration of intravenous contrast. Multiplanar CT image reconstructions and MIPs were obtained to evaluate the vascular anatomy. CONTRAST:  140mL OMNIPAQUE IOHEXOL 350 MG/ML SOLN COMPARISON:  None. FINDINGS: Cardiovascular: There is excellent opacification of the a pulmonary arterial tree. No intraluminal filling defect identified to suggest acute pulmonary embolism. The central pulmonary arteries are of normal caliber. Global cardiac size is within normal limits. Moderate hypertrophy of the left  ventricle, however, is noted. Trace pericardial effusion is present. The thoracic aorta is unremarkable. Bovine arch anatomy noted. Mediastinum/Nodes: Thyroid unremarkable. No pathologic thoracic adenopathy. Esophagus unremarkable. Lungs/Pleura: The lungs are symmetrically well expanded. There is a focal area of subpleural ground-glass pulmonary infiltrate within the a posterobasal right lower lobe, best seen on axial image # 96/6 which is nonspecific, but may represent the sequela of subacute trauma, remote infarction, or subacute to remote infection. The lungs are otherwise clear. No pneumothorax or pleural effusion. The central airways are widely patent. Upper Abdomen: Unremarkable Musculoskeletal: No acute bone abnormality. Review of the MIP images confirms the above findings. IMPRESSION: No pulmonary embolism. Moderate left ventricular hypertrophy. This could be better assessed with echocardiography. Tiny focus of subpleural ground-glass pulmonary infiltrate within the right lower lobe, nonspecific. Differential considerations are as listed above. Electronically Signed   By: Fidela Salisbury MD   On: 07/15/2020 01:16   DG Chest Portable 1 View  Result Date: 07/15/2020 CLINICAL DATA:  Chest pain EXAM: PORTABLE CHEST 1 VIEW COMPARISON:  None. FINDINGS: The heart size and mediastinal contours are within normal limits. Both lungs are clear. The visualized skeletal structures are unremarkable. IMPRESSION: No active disease. Electronically Signed   By: Ulyses Jarred M.D.   On: 07/15/2020 00:38      Medications:     Current Medications: . megestrol  40 mg Oral BID     Infusions: . sodium chloride    . heparin 1,100 Units/hr (07/15/20 0229)  . nitroGLYCERIN 7 mcg/min (07/15/20 0224)       Assessment/Plan   1. Chest pain Patient recently tested positive for a COVID-19 infection 10 days ago.  She had only mild symptoms and her cough is now resolved.  She presents to the hospital with acute  onset of severe left-sided chest pain.  She also is noted to have persistent sinus tachycardia.  CT of the chest is negative for a pulmonary embolism.  The high-sensitivity troponins are flat (104 and 122) which is not consistent with an ACS.  Viral myocarditis is certainly on the differential.  The  electrocardiogram shows significant LVH with repolarization abnormalities.  -Serial ECGs -Continue to trend cardiac enzymes -Aspirin 81 mg daily -Intravenous unfractionated heparin infusion -High intensity statins -Check a lipid panel -Echocardiogram to evaluate LV function  Thank you for involving [place] in the care of your patient.  We will leave further recommendations once more objective data is available for review.  If you have any questions or concerns please do not hesitate to contact us.   Meade Maw, MD  07/15/2020, 3:14 AM  Cardiology Overnight Team Please contact [facility] for night-coverage after hours (4p -7a ) and weekends on [email or web address]

## 2020-07-15 NOTE — Progress Notes (Signed)
  Echocardiogram 2D Echocardiogram has been performed.  Tracy Decker 07/15/2020, 9:50 AM

## 2020-07-15 NOTE — ED Notes (Signed)
Made Dr. Marlowe Sax aware of pts monitor continuing to alarm "ST elevation". Ekg repeated for comparison. Nitro drip titrated for Better BP control per parameters. Pt denies any change in pain with Nitro increase. States "pain comes and goes, nothing like it was"

## 2020-07-15 NOTE — Progress Notes (Signed)
ANTICOAGULATION CONSULT NOTE - Initial Consult  Pharmacy Consult for heparin Indication: chest pain/ACS  Allergies  Allergen Reactions  . Ibuprofen Other (See Comments)    Causes increase in BP    Patient Measurements: Height: 5\' 4"  (162.6 cm) Weight: 90.7 kg (200 lb) IBW/kg (Calculated) : 54.7 Heparin Dosing Weight: 75.1 kg  Vital Signs: Temp: 98.1 F (36.7 C) (01/25 0005) Temp Source: Oral (01/25 0005) BP: 139/88 (01/25 0045) Pulse Rate: 110 (01/25 0045)  Labs: Recent Labs    07/15/20 0020 07/15/20 0026  HGB 13.6 14.3  HCT 40.5 42.0  PLT 344  --   CREATININE 1.00 0.80  TROPONINIHS 104*  --     Estimated Creatinine Clearance: 88.7 mL/min (by C-G formula based on SCr of 0.8 mg/dL).   Medical History: Past Medical History:  Diagnosis Date  . Hypertension   . Renal disorder     Medications:  See medication history  Assessment: 55 yo lady to start heparin for CP.  She was not on anticoagulation PTA.  Hg 14.3, PTLC 344 Goal of Therapy:  Heparin level 0.3-0.7 units/ml Monitor platelets by anticoagulation protocol: Yes   Plan:  Heparin bolus 4000 units and drip at 1100 units/hr Check heparin level 6-8 hours after start Daily HL and CBC while on heparin Monitor for bleeding complications  Tracy Decker 07/15/2020,1:48 AM

## 2020-07-15 NOTE — ED Triage Notes (Signed)
Assume care from EMS, ems reports chest pain starting at 1030pm radiating to left side of neck and shoulder. EMS administer 500cc of NS, 324 of Asprin, 3 sublingual of Nitro and 8 mg of morphine with mild relief of s/s. EMS states pt has an hx of HTN and had covid 2 weeks ago   160/110 HR 130 02 98 RA  CBG 134  Temp 98.0 RR 24

## 2020-07-15 NOTE — Progress Notes (Signed)
ANTICOAGULATION CONSULT NOTE - Initial Consult  Pharmacy Consult for heparin Indication: chest pain/ACS  Allergies  Allergen Reactions  . Ibuprofen Other (See Comments)    Causes increase in BP    Patient Measurements: Height: 5\' 4"  (162.6 cm) Weight: 90.7 kg (200 lb) IBW/kg (Calculated) : 54.7 Heparin Dosing Weight: 75.1 kg  Vital Signs: Temp: 98.9 F (37.2 C) (01/25 0418) Temp Source: Oral (01/25 0418) BP: 146/91 (01/25 1000) Pulse Rate: 88 (01/25 1000)  Labs: Recent Labs    07/15/20 0020 07/15/20 0026 07/15/20 0207 07/15/20 0439 07/15/20 0652 07/15/20 0850  HGB 13.6 14.3  --  13.0  --   --   HCT 40.5 42.0  --  39.0  --   --   PLT 344  --   --  327  --   --   HEPARINUNFRC  --   --   --   --   --  >2.20*  CREATININE 1.00 0.80  --  0.96  --   --   TROPONINIHS 104*  --  122*  --  153* 156*    Estimated Creatinine Clearance: 73.9 mL/min (by C-G formula based on SCr of 0.96 mg/dL).   Medical History: Past Medical History:  Diagnosis Date  . Hypertension   . Renal disorder     Medications:  See medication history  Assessment: 65 yof on IV heparin for chest pain. She was not on anticoagulation PTA.  Currently on heparin infusion at 1100 units/hr. Initial HL was >2.2 but this level was drawn from the arm that heparin was infusing. A stat repeat check from opposite arm was therapeutic at 0.4. H/H and Plt wnl.   Goal of Therapy:  Heparin level 0.3-0.7 units/ml Monitor platelets by anticoagulation protocol: Yes   Plan:  Continue IV heparin at 1100 units/hr  Daily HL and CBC while on heparin Monitor for bleeding complications  Albertina Parr, PharmD., BCPS, BCCCP Clinical Pharmacist Please refer to [facility] for unit-specific pharmacist

## 2020-07-15 NOTE — Progress Notes (Signed)
PROGRESS NOTE    Tracy Decker  HUD:[id number] DOB: [birth year] DOA: 07/15/2020 PCP: [name], NP   Brief Narrative: 55 year old with past medical history significant for hypertension, obesity BMI 34% complaining of chest pain.  Patient reports that she wake up with chest pain, severe 9 out of 10 sharp in quality.  Pain radiated to her jaw and left arm.  She felt amputation. She tested positive for Covid 10 days prior to admission.  She had mild symptoms when she was diagnosed with Covid, sore throat and sinus.  Evaluation in the ED she was tachycardic, EKG showing some ST changes lateral lead, chest x-ray no active disease. CT angio chest negative for PE Cardiology was consulted due to concern for myopericarditis.   Assessment & Plan:   Principal Problem:   Chest pain Active Problems:   Sinus tachycardia   History of CHYIF-02   Metabolic acidosis   Essential hypertension   1-Chest pain, elevation of troponin Could be Related to myopericarditis. Cardiology with less concern for acute coronary syndrome Currently on IV nitroglycerin and heparin drip IV morphine ordered this morning due to recurrence of left arm pain and jaw pain,.  Started on aspirin and statins.   Sinus tachycardia: Resume metoprolol CT angio negative for PE TSH normal.   Recent COVID-19 viral infection: She tested positive for 10 days as an outpatient facility. He had mild symptoms. No indication for treatment at this time. She is out of the window for isolation precaution PCR still positive.   mild leukocytosis: Follow trend  Normal anion gap metabolic acidosis: Related to use of diuretics. NSL fluids.   Hypertension: Nitroglycerin drip, resume metoprolol and lisinopril.    Estimated body mass index is 34.33 kg/m as calculated from the following:   Height as of this encounter: 5\' 4"  (1.626 m).   Weight as of this encounter: 90.7 kg.   DVT prophylaxis: Heparin  Code Status: Full  code Family Communication:n patient and mother who was at bedside.  Disposition Plan:  Status is: Inpatient  Remains inpatient appropriate because:Ongoing diagnostic testing needed not appropriate for outpatient work up   Dispo: The patient is from: Home              Anticipated d/c is to: Home              Anticipated d/c date is: 2 days              Patient currently is not medically stable to d/c.   Difficult to place patient No        Consultants:   Cardiology   Procedures:   ECHO; Ef 75 %  Antimicrobials:    Subjective: She is complaining of mild headaches.  She started to have left arm pain and jaw pain.    Objective: Vitals:   07/15/20 0630 07/15/20 0700 07/15/20 0800 07/15/20 0835  BP: (!) 146/101 (!) 149/95 (!) 147/92 (!) 153/93  Pulse: (!) 101 95 91 94  Resp: 12 17 17 15   Temp:      TempSrc:      SpO2: 100% 99% 99% 99%  Weight:      Height:       No intake or output data in the 24 hours ending 07/15/20 0843 Filed Weights   07/15/20 0010  Weight: 90.7 kg    Examination:  General exam: Appears calm and comfortable  Respiratory system: Clear to auscultation. Respiratory effort normal. Cardiovascular system: S1 & S2 heard, RRR. No JVD,  murmurs, rubs, gallops or clicks. No pedal edema. Gastrointestinal system: Abdomen is nondistended, soft and nontender. No organomegaly or masses felt. Normal bowel sounds heard. Central nervous system: Alert and oriented. No focal neurological deficits. Extremities: Symmetric 5 x 5 power.   Data Reviewed: I have personally reviewed following labs and imaging studies  CBC: Recent Labs  Lab 07/15/20 0020 07/15/20 0026 07/15/20 0439  WBC 11.5*  --  10.6*  NEUTROABS 6.6  --   --   HGB 13.6 14.3 13.0  HCT 40.5 42.0 39.0  MCV 91.2  --  91.8  PLT 344  --  829   Basic Metabolic Panel: Recent Labs  Lab 07/15/20 0020 07/15/20 0026 07/15/20 0439  NA 139 142 138  K 3.9 3.9 4.2  CL 109 110 108  CO2 17*   --  19*  GLUCOSE 114* 112* 166*  BUN 17 19 14   CREATININE 1.00 0.80 0.96  CALCIUM 8.9  --  9.1   GFR: Estimated Creatinine Clearance: 73.9 mL/min (by C-G formula based on SCr of 0.96 mg/dL). Liver Function Tests: Recent Labs  Lab 07/15/20 0020  AST 13*  ALT 13  ALKPHOS 51  BILITOT 0.8  PROT 6.9  ALBUMIN 3.6   No results for input(s): LIPASE, AMYLASE in the last 168 hours. No results for input(s): AMMONIA in the last 168 hours. Coagulation Profile: No results for input(s): INR, PROTIME in the last 168 hours. Cardiac Enzymes: No results for input(s): CKTOTAL, CKMB, CKMBINDEX, TROPONINI in the last 168 hours. BNP (last 3 results) No results for input(s): PROBNP in the last 8760 hours. HbA1C: No results for input(s): HGBA1C in the last 72 hours. CBG: No results for input(s): GLUCAP in the last 168 hours. Lipid Profile: No results for input(s): CHOL, HDL, LDLCALC, TRIG, CHOLHDL, LDLDIRECT in the last 72 hours. Thyroid Function Tests: Recent Labs    07/15/20 0439  TSH 1.994   Anemia Panel: No results for input(s): VITAMINB12, FOLATE, FERRITIN, TIBC, IRON, RETICCTPCT in the last 72 hours. Sepsis Labs: No results for input(s): PROCALCITON, LATICACIDVEN in the last 168 hours.  Recent Results (from the past 240 hour(s))  SARS Coronavirus 2 by RT PCR (hospital order, performed in [facility] hospital lab) Nasopharyngeal Nasopharyngeal Swab     Status: Abnormal   Collection Time: 07/15/20  2:31 AM   Specimen: Nasopharyngeal Swab  Result Value Ref Range Status   SARS Coronavirus 2 POSITIVE (A) NEGATIVE Final    Comment: RESULT CALLED TO, READ BACK BY AND VERIFIED WITH: Nolon Stalls 0350 07/15/2020 T. TYSOR (NOTE) SARS-CoV-2 target nucleic acids are DETECTED  SARS-CoV-2 RNA is generally detectable in upper respiratory specimens  during the acute phase of infection.  Positive results are indicative  of the presence of the identified virus, but do not rule out bacterial  infection or co-infection with other pathogens not detected by the test.  Clinical correlation with patient history and  other diagnostic information is necessary to determine patient infection status.  The expected result is negative.  Fact Sheet for Patients:   StrictlyIdeas.no   Fact Sheet for Healthcare Providers:   BankingDealers.co.za    This test is not yet approved or cleared by the [place] FDA and  has been authorized for detection and/or diagnosis of SARS-CoV-2 by FDA under an Emergency Use Authorization (EUA).  This EUA will remain in effect (meaning this  test can be used) for the duration of  the COVID-19 declaration under Section 564(b)(1) of the Act, 21 U.S.C.  section 360-bbb-3(b)(1), unless the authorization is terminated or revoked sooner.  Performed at [facility], [place] [address], [place], [place] [address]          Radiology Studies: CT Angio Chest PE W and/or Wo Contrast  Result Date: 07/15/2020 CLINICAL DATA:  Chest pain, dyspnea EXAM: CT ANGIOGRAPHY CHEST WITH CONTRAST TECHNIQUE: Multidetector CT imaging of the chest was performed using the standard protocol during bolus administration of intravenous contrast. Multiplanar CT image reconstructions and MIPs were obtained to evaluate the vascular anatomy. CONTRAST:  181mL OMNIPAQUE IOHEXOL 350 MG/ML SOLN COMPARISON:  None. FINDINGS: Cardiovascular: There is excellent opacification of the a pulmonary arterial tree. No intraluminal filling defect identified to suggest acute pulmonary embolism. The central pulmonary arteries are of normal caliber. Global cardiac size is within normal limits. Moderate hypertrophy of the left ventricle, however, is noted. Trace pericardial effusion is present. The thoracic aorta is unremarkable. Bovine arch anatomy noted. Mediastinum/Nodes: Thyroid unremarkable. No pathologic thoracic adenopathy. Esophagus unremarkable.  Lungs/Pleura: The lungs are symmetrically well expanded. There is a focal area of subpleural ground-glass pulmonary infiltrate within the a posterobasal right lower lobe, best seen on axial image # 96/6 which is nonspecific, but may represent the sequela of subacute trauma, remote infarction, or subacute to remote infection. The lungs are otherwise clear. No pneumothorax or pleural effusion. The central airways are widely patent. Upper Abdomen: Unremarkable Musculoskeletal: No acute bone abnormality. Review of the MIP images confirms the above findings. IMPRESSION: No pulmonary embolism. Moderate left ventricular hypertrophy. This could be better assessed with echocardiography. Tiny focus of subpleural ground-glass pulmonary infiltrate within the right lower lobe, nonspecific. Differential considerations are as listed above. Electronically Signed   By: Fidela Salisbury MD   On: 07/15/2020 01:16   DG Chest Portable 1 View  Result Date: 07/15/2020 CLINICAL DATA:  Chest pain EXAM: PORTABLE CHEST 1 VIEW COMPARISON:  None. FINDINGS: The heart size and mediastinal contours are within normal limits. Both lungs are clear. The visualized skeletal structures are unremarkable. IMPRESSION: No active disease. Electronically Signed   By: Ulyses Jarred M.D.   On: 07/15/2020 00:38        Scheduled Meds: . megestrol  40 mg Oral BID  . metoprolol tartrate  12.5 mg Oral BID  . rosuvastatin  40 mg Oral Daily   Continuous Infusions: . heparin 1,100 Units/hr (07/15/20 0229)  . nitroGLYCERIN 30 mcg/min (07/15/20 0821)     LOS: 0 days    Time spent: 35 minutes    Rhegan Trunnell A Tika Hannis, MD [facility]   If 7PM-7AM, please contact night-coverage [email or web address]  07/15/2020, 8:43 AM

## 2020-07-15 NOTE — ED Notes (Signed)
Stat Troponin sent to lab at [phone]

## 2020-07-15 NOTE — Progress Notes (Signed)
ANTICOAGULATION CONSULT NOTE  Pharmacy Consult:  Heparin Indication: chest pain/ACS  Allergies  Allergen Reactions  . Ibuprofen Other (See Comments)    Causes increase in BP    Patient Measurements: Height: 5\' 4"  (162.6 cm) Weight: 90.7 kg (200 lb) IBW/kg (Calculated) : 54.7 Heparin Dosing Weight: 75.1 kg  Vital Signs: Temp: 98.7 F (37.1 C) (01/25 2023) Temp Source: Oral (01/25 2023) BP: 161/96 (01/25 2023) Pulse Rate: 84 (01/25 2023)  Labs: Recent Labs    07/15/20 0020 07/15/20 0026 07/15/20 0207 07/15/20 0439 07/15/20 6644 07/15/20 0850 07/15/20 1054 07/15/20 1350 07/15/20 2057  HGB 13.6 14.3  --  13.0  --   --   --   --   --   HCT 40.5 42.0  --  39.0  --   --   --   --   --   PLT 344  --   --  327  --   --   --   --   --   HEPARINUNFRC  --   --   --   --   --  >2.20* 0.40  --  0.40  CREATININE 1.00 0.80  --  0.96  --   --   --   --   --   TROPONINIHS 104*  --    < >  --  153* 156*  --  149*  --    < > = values in this interval not displayed.    Estimated Creatinine Clearance: 73.9 mL/min (by C-G formula based on SCr of 0.96 mg/dL).   Assessment: 54 YOF on IV heparin for chest pain.  Repeat heparin level is therapeutic; no bleeding reported.  Goal of Therapy:  Heparin level 0.3-0.7 units/ml Monitor platelets by anticoagulation protocol: Yes   Plan:  Continue IV heparin at 1100 units/hr  F/U AM labs  Nicky Kras D. Mina Marble, PharmD, BCPS, [place] 07/15/2020, 9:27 PM

## 2020-07-15 NOTE — ED Notes (Signed)
Pt states arm pain has returned 4/10. HA is a 5/10. Tylenol given per order.

## 2020-07-15 NOTE — H&P (Signed)
History and Physical    Tracy Decker [id number] DOB: Jun 23, 1965 DOA: 07/15/2020  PCP: [name], NP Patient coming from: Home  Chief Complaint: Chest pain  HPI: Tracy Decker is a 55 y.o. female with medical history significant of hypertension, obesity (BMI 34.33) presenting to the ED via EMS with a chief complaint of chest pain.  EMS administered 500 cc of normal saline, aspirin 324 mg, sublingual nitroglycerin x3, and morphine 8 mg with mild relief of symptoms. Patient states tonight around 10 PM she woke up from her sleep with severe 9 out of 10 intensity sharp left-sided chest pain.  Her jaw and left arm were also hurting at that time.  Her heart started racing and it felt like waking up from a nightmare.  States after receiving morphine and nitroglycerin she now feels better and the chest pain is 2 out of 10 in intensity.  Denies history of CAD.  States she tested positive for Covid infection 10 days ago but only had minor symptoms which did not require medical treatment.  Her only symptom was a cough which has now resolved.  Denies fevers, chills, body aches, or shortness of breath.  ED Course: Afebrile.  Persistently tachycardic.  Not tachypneic or hypoxic.  Not hypotensive.  WBC 11.5, hemoglobin 13.6, platelet count 344K.  Sodium 139, potassium 3.9, chloride 109, bicarb 17, anion gap 13, BUN 17, creatinine 1.0, glucose 114.  Initial high-sensitivity troponin 104, repeat pending.  EKG showing ST/T wave changes in inferior and lateral leads, no prior tracing for comparison.  Chest x-ray showing no active disease.  CT angiogram chest negative for PE.  Showing moderate LVH.  Tiny focus of subpleural groundglass pulmonary infiltrate within the right lower lobe, nonspecific but may represent sequela of subacute trauma, remote infarction, or subacute to remote infection..  Lungs are otherwise clear.  ED provider discussed the case with Dr. Paticia Stack, cardiology will consult.  Patient was  started on heparin and nitroglycerin infusion.  Review of Systems:  All systems reviewed and apart from history of presenting illness, are negative.  Past Medical History:  Diagnosis Date  . Hypertension   . Renal disorder     Past Surgical History:  Procedure Laterality Date  . ADRENAL GLAND SURGERY Bilateral      reports that she has never smoked. She has never used smokeless tobacco. She reports previous alcohol use. She reports that she does not use drugs.  Allergies  Allergen Reactions  . Ibuprofen Other (See Comments)    Causes increase in BP    Family History  Problem Relation Age of Onset  . Breast cancer Maternal Grandmother     Prior to Admission medications   Medication Sig Start Date End Date Taking? Authorizing Provider  amLODipine (NORVASC) 10 MG tablet Take 10 mg by mouth daily.   Yes [provider]  Cholecalciferol (VITAMIN D3) 50 MCG (2000 UT) CAPS Take 2,000 Units by mouth daily.   Yes [provider]  Fluocinolone Acetonide Scalp 0.01 % OIL Apply 1 application topically daily as needed for irritation. 02/01/20  Yes [provider]  hydrochlorothiazide (MICROZIDE) 12.5 MG capsule Take 12.5 mg by mouth daily. 06/16/20  Yes [provider]  lisinopril (ZESTRIL) 40 MG tablet Take 40 mg by mouth daily.   Yes [provider]  megestrol (MEGACE) 40 MG tablet Take 1 tablet (40 mg total) by mouth 2 (two) times daily. Can increase to two tablets twice a day in the event of heavy bleeding  05/22/20  Yes Truett Mainland, DO  metoprolol tartrate (LOPRESSOR) 25 MG tablet Take 25 mg by mouth at bedtime.   Yes [provider]  minoxidil (LONITEN) 2.5 MG tablet Take 5 mg by mouth daily.   Yes [provider]  spironolactone (ALDACTONE) 50 MG tablet Take 50 mg by mouth daily.   Yes [provider]  zolpidem (AMBIEN) 10 MG tablet Take 10 mg by mouth at bedtime as needed for sleep. 07/09/20  Yes [provider]    Physical Exam: Vitals:   07/15/20 0005 07/15/20 0010 07/15/20 0030 07/15/20 0045  BP: (!) 141/101  (!) 147/100 139/88  Pulse: (!) 125  (!) 117 (!) 110  Resp: 18  12 17   Temp: 98.1 F (36.7 C)     TempSrc: Oral     SpO2: 98%  98% 97%  Weight:  90.7 kg    Height:  5\' 4"  (1.626 m)      Physical Exam Constitutional:      General: She is not in acute distress. HENT:     Head: Normocephalic and atraumatic.  Eyes:     Extraocular Movements: Extraocular movements intact.     Conjunctiva/sclera: Conjunctivae normal.  Cardiovascular:     Rate and Rhythm: Regular rhythm. Tachycardia present.     Pulses: Normal pulses.  Pulmonary:     Effort: Pulmonary effort is normal. No respiratory distress.     Breath sounds: Normal breath sounds. No wheezing or rales.  Abdominal:     General: Bowel sounds are normal. There is no distension.     Palpations: Abdomen is soft.     Tenderness: There is no abdominal tenderness.  Musculoskeletal:        General: No swelling or tenderness.     Cervical back: Normal range of motion and neck supple.  Skin:    General: Skin is warm and dry.  Neurological:     General: No focal deficit present.     Mental Status: She is alert and oriented to person, place, and time.     Labs on Admission: I have personally reviewed following labs and imaging studies  CBC: Recent Labs  Lab 07/15/20 0020 07/15/20 0026  WBC 11.5*  --   NEUTROABS 6.6  --   HGB 13.6 14.3  HCT 40.5 42.0  MCV 91.2  --   PLT 344  --    Basic Metabolic Panel: Recent Labs  Lab 07/15/20 0020 07/15/20 0026  NA 139 142  K 3.9 3.9  CL 109 110  CO2 17*  --   GLUCOSE 114* 112*  BUN 17 19  CREATININE 1.00 0.80  CALCIUM 8.9  --    GFR: Estimated Creatinine Clearance: 88.7 mL/min (by C-G formula based on SCr of 0.8 mg/dL). Liver Function Tests: Recent Labs  Lab 07/15/20 0020  AST 13*  ALT 13  ALKPHOS 51  BILITOT 0.8  PROT 6.9  ALBUMIN 3.6   No  results for input(s): LIPASE, AMYLASE in the last 168 hours. No results for input(s): AMMONIA in the last 168 hours. Coagulation Profile: No results for input(s): INR, PROTIME in the last 168 hours. Cardiac Enzymes: No results for input(s): CKTOTAL, CKMB, CKMBINDEX, TROPONINI in the last 168 hours. BNP (last 3 results) No results for input(s): PROBNP in the last 8760 hours. HbA1C: No results for input(s): HGBA1C in the last 72 hours. CBG: No results for input(s): GLUCAP in the last 168 hours. Lipid Profile: No results for input(s): CHOL, HDL,  LDLCALC, TRIG, CHOLHDL, LDLDIRECT in the last 72 hours. Thyroid Function Tests: No results for input(s): TSH, T4TOTAL, FREET4, T3FREE, THYROIDAB in the last 72 hours. Anemia Panel: No results for input(s): VITAMINB12, FOLATE, FERRITIN, TIBC, IRON, RETICCTPCT in the last 72 hours. Urine analysis: No results found for: COLORURINE, APPEARANCEUR, LABSPEC, PHURINE, GLUCOSEU, HGBUR, BILIRUBINUR, KETONESUR, PROTEINUR, UROBILINOGEN, NITRITE, LEUKOCYTESUR  Radiological Exams on Admission: CT Angio Chest PE W and/or Wo Contrast  Result Date: 07/15/2020 CLINICAL DATA:  Chest pain, dyspnea EXAM: CT ANGIOGRAPHY CHEST WITH CONTRAST TECHNIQUE: Multidetector CT imaging of the chest was performed using the standard protocol during bolus administration of intravenous contrast. Multiplanar CT image reconstructions and MIPs were obtained to evaluate the vascular anatomy. CONTRAST:  140mL OMNIPAQUE IOHEXOL 350 MG/ML SOLN COMPARISON:  None. FINDINGS: Cardiovascular: There is excellent opacification of the a pulmonary arterial tree. No intraluminal filling defect identified to suggest acute pulmonary embolism. The central pulmonary arteries are of normal caliber. Global cardiac size is within normal limits. Moderate hypertrophy of the left ventricle, however, is noted. Trace pericardial effusion is present. The thoracic aorta is unremarkable. Bovine arch anatomy noted.  Mediastinum/Nodes: Thyroid unremarkable. No pathologic thoracic adenopathy. Esophagus unremarkable. Lungs/Pleura: The lungs are symmetrically well expanded. There is a focal area of subpleural ground-glass pulmonary infiltrate within the a posterobasal right lower lobe, best seen on axial image # 96/6 which is nonspecific, but may represent the sequela of subacute trauma, remote infarction, or subacute to remote infection. The lungs are otherwise clear. No pneumothorax or pleural effusion. The central airways are widely patent. Upper Abdomen: Unremarkable Musculoskeletal: No acute bone abnormality. Review of the MIP images confirms the above findings. IMPRESSION: No pulmonary embolism. Moderate left ventricular hypertrophy. This could be better assessed with echocardiography. Tiny focus of subpleural ground-glass pulmonary infiltrate within the right lower lobe, nonspecific. Differential considerations are as listed above. Electronically Signed   By: Fidela Salisbury MD   On: 07/15/2020 01:16   DG Chest Portable 1 View  Result Date: 07/15/2020 CLINICAL DATA:  Chest pain EXAM: PORTABLE CHEST 1 VIEW COMPARISON:  None. FINDINGS: The heart size and mediastinal contours are within normal limits. Both lungs are clear. The visualized skeletal structures are unremarkable. IMPRESSION: No active disease. Electronically Signed   By: Ulyses Jarred M.D.   On: 07/15/2020 00:38    EKG: Independently reviewed.  Sinus tachycardia.  ST/T wave changes in inferior and lateral leads.  No prior tracing for comparison.  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  High-sensitivity troponin 104. EKG showing ST/T wave changes in inferior and lateral leads, no prior tracing for comparison.  Does have risk factors for CAD.  In addition, viral  myocarditis is also possibility given recent Covid infection. -Cardiac monitoring.  Continue heparin for anticoagulation.  Patient was given full dose aspirin by EMS.  Continue nitroglycerin infusion.  Echocardiogram has been ordered.  Continue to trend troponin.  Cardiology consulted by ED provider.  Sinus tachycardia: CT angiogram negative for PE.  Heart rate currently <105. -Check TSH level.  Give IV fluid hydration.  Recent COVID-19 viral infection: Tested positive for Covid 10 days ago at an outpatient facility.  Patient states she was only mildly symptomatic with a cough and had no dyspnea.  Cough has now resolved.  Mildly tachycardic but no fever or  signs of sepsis.  CT showing a tiny focus of subpleural groundglass pulmonary infiltrate within the right lower lobe, nonspecific but radiologist feels may represent sequela of subacute trauma, remote infarction, or subacute to remote infection.  Lungs are otherwise clear on CT.  Not tachypneic or hypoxic. -No indication for treatment at this time.  Out of the window for isolation precautions.  Borderline leukocytosis: Likely reactive.  No fever.  Chest imaging not suggestive of acute infection.  Not endorsing any UTI symptoms. -Repeat CBC in a.m.  Normal anion gap metabolic acidosis: Bicarb 17, anion gap 13.  Likely related to use of diuretics for hypertension. -Hold diuretics.  IV fluid hydration and continue to monitor BMP.  Hypertension: Currently normotensive. -On nitroglycerin infusion at this time.  Hold home antihypertensives to avoid hypotension.  DVT prophylaxis: Heparin Code Status: Full code Family Communication: No family available this time. Disposition Plan: Status is: Inpatient  Remains inpatient appropriate because:IV treatments appropriate due to intensity of illness or inability to take PO, Inpatient level of care appropriate due to severity of illness and Concern for ACS   Dispo: The patient is from: Home               Anticipated d/c is to: Home              Anticipated d/c date is: 3 days              Patient currently is not medically stable to d/c.   Difficult to place patient No  Consults called: Cardiology (Dr. Paticia Stack) Level of care: Level of care: Progressive The medical decision making on this patient was of high complexity and the patient is at high risk for clinical deterioration, therefore this is a level 3 visit.  Shela Leff MD [facility]  If 7PM-7AM, please contact night-coverage [email or web address]  07/15/2020, 2:31 AM

## 2020-07-15 NOTE — ED Notes (Signed)
Cardiology paged per attending request.

## 2020-07-15 NOTE — ED Notes (Signed)
Patient transported to CT 

## 2020-07-15 NOTE — ED Provider Notes (Signed)
[facility] EMERGENCY DEPARTMENT Provider Note   CSN: [id number] Arrival date & time: 07/15/20  0003     History Chief Complaint  Patient presents with  . Chest Pain    Tracy Decker is a 55 y.o. female.  The history is provided by the patient.  Chest Pain Pain location:  L chest Pain radiates to:  Neck and L shoulder Pain severity:  Severe Onset quality:  Sudden Duration:  2 hours Timing:  Constant Progression:  Unchanged Chronicity:  New Context: at rest   Relieved by:  Nothing Worsened by:  Nothing Ineffective treatments: Aspirin, morphine and NTG. Associated symptoms: cough and palpitations   Associated symptoms: no abdominal pain, no diaphoresis and no fever   Risk factors: hypertension   Patient with COVID 2 weeks ago presents with palpations and "chest not feeling right" that started at rest at 1030 pm.  No DOE.  No diaphoresis.      Past Medical History:  Diagnosis Date  . Hypertension   . Renal disorder     Patient Active Problem List   Diagnosis Date Noted  . Closed displaced fracture of distal phalanx of lesser toe of left foot 11/05/2019    Past Surgical History:  Procedure Laterality Date  . ADRENAL GLAND SURGERY Bilateral      OB History    Gravida  1   Para  1   Term  1   Preterm      AB      Living  1     SAB      IAB      Ectopic      Multiple      Live Births  1           Family History  Problem Relation Age of Onset  . Breast cancer Maternal Grandmother     Social History   Tobacco Use  . Smoking status: Never Smoker  . Smokeless tobacco: Never Used  Substance Use Topics  . Alcohol use: Not Currently  . Drug use: Never    Home Medications Prior to Admission medications   Medication Sig Start Date End Date Taking? Authorizing Provider  amLODipine (NORVASC) 5 MG tablet Take by mouth.    [provider]  lisinopril (ZESTRIL) 5 MG tablet Take by mouth.    [provider]  megestrol (MEGACE) 40 MG tablet Take 1 tablet (40 mg total) by mouth 2 (two) times daily. Can increase to two tablets twice a day in the event of heavy bleeding 05/22/20   Truett Mainland, DO  metoprolol tartrate (LOPRESSOR) 25 MG tablet Take by mouth.    [provider]  minoxidil (LONITEN) 2.5 MG tablet Take by mouth.    [provider]    Allergies    Ibuprofen  Review of Systems   Review of Systems  Constitutional: Negative for diaphoresis and fever.  HENT: Negative for congestion.   Eyes: Negative for visual disturbance.  Respiratory: Positive for cough.   Cardiovascular: Positive for chest pain and palpitations. Negative for leg swelling.  Gastrointestinal: Negative for abdominal pain.  Genitourinary: Negative for difficulty urinating.  Musculoskeletal: Negative for arthralgias.  Psychiatric/Behavioral: Negative for agitation.  All other systems reviewed and are negative.   Physical Exam Updated Vital Signs BP (!) 147/100   Pulse (!) 117   Temp 98.1 F (36.7 C) (Oral)   Resp 12   Ht 5\' 4"  (1.626 m)   Wt 90.7 kg  SpO2 98%   BMI 34.33 kg/m   Physical Exam Vitals and nursing note reviewed.  Constitutional:      Appearance: Normal appearance. She is not diaphoretic.  HENT:     Head: Normocephalic and atraumatic.  Eyes:     Conjunctiva/sclera: Conjunctivae normal.     Pupils: Pupils are equal, round, and reactive to light.  Cardiovascular:     Rate and Rhythm: Regular rhythm. Tachycardia present.     Pulses: Normal pulses.     Heart sounds: Normal heart sounds.  Pulmonary:     Effort: Pulmonary effort is normal.     Breath sounds: Normal breath sounds.  Abdominal:     General: Abdomen is flat. Bowel sounds are normal.     Palpations: Abdomen is soft.     Tenderness: There is no abdominal tenderness. There is no guarding.  Musculoskeletal:        General: Normal range of motion.     Cervical back: Normal range of motion and  neck supple.  Skin:    General: Skin is warm and dry.     Capillary Refill: Capillary refill takes less than 2 seconds.  Neurological:     General: No focal deficit present.     Mental Status: She is alert and oriented to person, place, and time.     Deep Tendon Reflexes: Reflexes normal.  Psychiatric:        Mood and Affect: Mood normal.        Behavior: Behavior normal.     ED Results / Procedures / Treatments   Labs (all labs ordered are listed, but only abnormal results are displayed) Results for orders placed or performed during the hospital encounter of 07/15/20  CBC with Differential/Platelet  Result Value Ref Range   WBC 11.5 (H) 4.0 - 10.5 K/uL   RBC 4.44 3.87 - 5.11 MIL/uL   Hemoglobin 13.6 12.0 - 15.0 g/dL   HCT 14.7 82.9 - 56.2 %   MCV 91.2 80.0 - 100.0 fL   MCH 30.6 26.0 - 34.0 pg   MCHC 33.6 30.0 - 36.0 g/dL   RDW 13.0 86.5 - 78.4 %   Platelets 344 150 - 400 K/uL   nRBC 0.0 0.0 - 0.2 %   Neutrophils Relative % 58 %   Neutro Abs 6.6 1.7 - 7.7 K/uL   Lymphocytes Relative 31 %   Lymphs Abs 3.6 0.7 - 4.0 K/uL   Monocytes Relative 10 %   Monocytes Absolute 1.2 (H) 0.1 - 1.0 K/uL   Eosinophils Relative 1 %   Eosinophils Absolute 0.1 0.0 - 0.5 K/uL   Basophils Relative 0 %   Basophils Absolute 0.0 0.0 - 0.1 K/uL   Immature Granulocytes 0 %   Abs Immature Granulocytes 0.03 0.00 - 0.07 K/uL  Comprehensive metabolic panel  Result Value Ref Range   Sodium 139 135 - 145 mmol/L   Potassium 3.9 3.5 - 5.1 mmol/L   Chloride 109 98 - 111 mmol/L   CO2 17 (L) 22 - 32 mmol/L   Glucose, Bld 114 (H) 70 - 99 mg/dL   BUN 17 6 - 20 mg/dL   Creatinine, Ser 6.96 0.44 - 1.00 mg/dL   Calcium 8.9 8.9 - 29.5 mg/dL   Total Protein 6.9 6.5 - 8.1 g/dL   Albumin 3.6 3.5 - 5.0 g/dL   AST 13 (L) 15 - 41 U/L   ALT 13 0 - 44 U/L   Alkaline Phosphatase 51 38 - 126 U/L  Total Bilirubin 0.8 0.3 - 1.2 mg/dL   GFR, Estimated >60 >60 mL/min   Anion gap 13 5 - 15  I-stat chem 8, ED (not  at [facility] or [facility])  Result Value Ref Range   Sodium 142 135 - 145 mmol/L   Potassium 3.9 3.5 - 5.1 mmol/L   Chloride 110 98 - 111 mmol/L   BUN 19 6 - 20 mg/dL   Creatinine, Ser 0.80 0.44 - 1.00 mg/dL   Glucose, Bld 112 (H) 70 - 99 mg/dL   Calcium, Ion 1.17 1.15 - 1.40 mmol/L   TCO2 19 (L) 22 - 32 mmol/L   Hemoglobin 14.3 12.0 - 15.0 g/dL   HCT 42.0 36.0 - 46.0 %  I-Stat Beta hCG blood, ED ([facility], [facility], [facility] only)  Result Value Ref Range   I-stat hCG, quantitative <5.0 <5 mIU/mL   Comment 3          Troponin I (High Sensitivity)  Result Value Ref Range   Troponin I (High Sensitivity) 104 (HH) <18 ng/L   CT Angio Chest PE W and/or Wo Contrast  Result Date: 07/15/2020 CLINICAL DATA:  Chest pain, dyspnea EXAM: CT ANGIOGRAPHY CHEST WITH CONTRAST TECHNIQUE: Multidetector CT imaging of the chest was performed using the standard protocol during bolus administration of intravenous contrast. Multiplanar CT image reconstructions and MIPs were obtained to evaluate the vascular anatomy. CONTRAST:  169mL OMNIPAQUE IOHEXOL 350 MG/ML SOLN COMPARISON:  None. FINDINGS: Cardiovascular: There is excellent opacification of the a pulmonary arterial tree. No intraluminal filling defect identified to suggest acute pulmonary embolism. The central pulmonary arteries are of normal caliber. Global cardiac size is within normal limits. Moderate hypertrophy of the left ventricle, however, is noted. Trace pericardial effusion is present. The thoracic aorta is unremarkable. Bovine arch anatomy noted. Mediastinum/Nodes: Thyroid unremarkable. No pathologic thoracic adenopathy. Esophagus unremarkable. Lungs/Pleura: The lungs are symmetrically well expanded. There is a focal area of subpleural ground-glass pulmonary infiltrate within the a posterobasal right lower lobe, best seen on axial image # 96/6 which is nonspecific, but may represent the sequela of subacute trauma, remote infarction, or subacute to remote infection. The lungs are  otherwise clear. No pneumothorax or pleural effusion. The central airways are widely patent. Upper Abdomen: Unremarkable Musculoskeletal: No acute bone abnormality. Review of the MIP images confirms the above findings. IMPRESSION: No pulmonary embolism. Moderate left ventricular hypertrophy. This could be better assessed with echocardiography. Tiny focus of subpleural ground-glass pulmonary infiltrate within the right lower lobe, nonspecific. Differential considerations are as listed above. Electronically Signed   By: Fidela Salisbury MD   On: 07/15/2020 01:16   DG Chest Portable 1 View  Result Date: 07/15/2020 CLINICAL DATA:  Chest pain EXAM: PORTABLE CHEST 1 VIEW COMPARISON:  None. FINDINGS: The heart size and mediastinal contours are within normal limits. Both lungs are clear. The visualized skeletal structures are unremarkable. IMPRESSION: No active disease. Electronically Signed   By: Ulyses Jarred M.D.   On: 07/15/2020 00:38  ' EKG EKG Interpretation  Date/Time:  Tuesday July 15 2020 00:31:12 EST Ventricular Rate:  118 PR Interval:    QRS Duration: 77 QT Interval:  337 QTC Calculation: 473 R Axis:   26 Text Interpretation: Sinus tachycardia Atrial premature complex Probable left atrial enlargement LVH with secondary repolarization abnormality Confirmed by Randal Buba, Naika Noto ([id number]) on 07/15/2020 12:48:44 AM   Radiology DG Chest Portable 1 View  Result Date: 07/15/2020 CLINICAL DATA:  Chest pain EXAM: PORTABLE CHEST 1 VIEW COMPARISON:  None. FINDINGS: The  heart size and mediastinal contours are within normal limits. Both lungs are clear. The visualized skeletal structures are unremarkable. IMPRESSION: No active disease. Electronically Signed   By: Ulyses Jarred M.D.   On: 07/15/2020 00:38    Procedures Procedures  MDM Reviewed: nursing note and vitals Interpretation: labs, ECG, x-ray and CT scan (np PE by me on CTA, no PNA on cxr positive troponin ) Total time providing critical care:  30-74 minutes (heparin gtt). This excludes time spent performing separately reportable procedures and services. Consults: admitting MD and cardiology  CRITICAL CARE Performed by: Ebelyn Bohnet K Dexter Sauser-Rasch Total critical care time: 60 minutes Critical care time was exclusive of separately billable procedures and treating other patients. Critical care was necessary to treat or prevent imminent or life-threatening deterioration. Critical care was time spent personally by me on the following activities: development of treatment plan with patient and/or surrogate as well as nursing, discussions with consultants, evaluation of patient's response to treatment, examination of patient, obtaining history from patient or surrogate, ordering and performing treatments and interventions, ordering and review of laboratory studies, ordering and review of radiographic studies, pulse oximetry and re-evaluation of patient's condition.  Medications Ordered in ED Medications  sodium chloride 0.9 % bolus 500 mL (has no administration in time range)  nitroGLYCERIN 50 mg in dextrose 5 % 250 mL (0.2 mg/mL) infusion (has no administration in time range)  heparin ADULT infusion 100 units/mL (25000 units/230mL) (has no administration in time range)  heparin bolus via infusion 4,000 Units (has no administration in time range)  iohexol (OMNIPAQUE) 350 MG/ML injection 100 mL (100 mLs Intravenous Contrast Given 07/15/20 0107)    ED Course  I have reviewed the triage vital signs and the nursing notes.  Pertinent labs & imaging results that were available during my care of the patient were reviewed by me and considered in my medical decision making (see chart for details).   145 case d/w Dr. Jeannette Corpus of cardiology, trend troponins, NTG and heparin and admit to medicine cardiology will consult   Tracy Decker was evaluated in Emergency Department on 07/15/2020 for the symptoms described in the history of present illness. She was  evaluated in the context of the global COVID-19 pandemic, which necessitated consideration that the patient might be at risk for infection with the SARS-CoV-2 virus that causes COVID-19. Institutional protocols and algorithms that pertain to the evaluation of patients at risk for COVID-19 are in a state of rapid change based on information released by regulatory bodies including the CDC and federal and state organizations. These policies and algorithms were followed during the patient's care in the ED.  Final Clinical Impression(s) / ED Diagnoses   Admit to medicine    Ilyana Manuele, MD 07/15/20 0254

## 2020-07-15 NOTE — Progress Notes (Addendum)
Progress Note  Patient Name: Tracy Decker Date of Encounter: 07/15/2020  Primary Cardiologist: New  Subjective   Per patient's history, initial sx was being awoken by rapid HR overnight with palpitations. Also initially had sharp knifelike chest pain in the middle of her chest, followed by vague arm pain, left shoulder pain, and jaw pain. This has been coming and going since that time, recurred in ED associated with chest pressure. Not worse or better with palpation, movement or inspiration. NTG and morphine have given some relief, but morphine has made her feel like there is a pre-teen sitting on her chest.  Inpatient Medications    Scheduled Meds: . aspirin  325 mg Oral Daily  . lisinopril  40 mg Oral Daily  . megestrol  40 mg Oral BID  . metoprolol tartrate  12.5 mg Oral BID  . rosuvastatin  40 mg Oral Daily   Continuous Infusions: . heparin 1,100 Units/hr (07/15/20 0229)  . nitroGLYCERIN 30 mcg/min (07/15/20 0821)   PRN Meds: acetaminophen, morphine injection, ondansetron (ZOFRAN) IV   Vital Signs    Vitals:   07/15/20 0630 07/15/20 0700 07/15/20 0800 07/15/20 0835  BP: (!) 146/101 (!) 149/95 (!) 147/92 (!) 153/93  Pulse: (!) 101 95 91 94  Resp: _0 Temp:      TempSrc:      SpO2: 100% 99% 99% 99%  Weight:      Height:       No intake or output data in the 24 hours ending 07/15/20 0912 Last 3 Weights 07/15/2020 04/16/2020 11/05/2019  Weight (lbs) 200 lb 201 lb 1.3 oz 190 lb  Weight (kg) 90.719 kg 91.209 kg 86.183 kg     Telemetry    NSR - Personally Reviewed  ECG    Multiple tracings reviewed suggestive of NSR with LVH but most recent tracing NSR 99bpm LAE, probable LVH with diffuse TWI I, II, III, avL, avF, V5-V6, and coved ST appearance in V1-V3- Personally Reviewed  Physical Exam   GEN: No acute distress.  HEENT: Normocephalic, atraumatic, sclera non-icteric. Neck: No JVD or bruits. Cardiac: RRR no murmurs, rubs, or gallops.  Radials/DP/PT  1+ and equal bilaterally.  Respiratory: Clear to auscultation bilaterally. Breathing is unlabored. GI: Soft, nontender, non-distended, BS +x 4. MS: no deformity. Extremities: No clubbing or cyanosis. No edema. Distal pedal pulses are 2+ and equal bilaterally. Neuro:  AAOx3. Follows commands. Psych:  Responds to questions appropriately with a normal affect.  Labs    High Sensitivity Troponin:   Recent Labs  Lab 07/15/20 0020 07/15/20 0207 07/15/20 0652  TROPONINIHS 104* 122* 153*      Cardiac EnzymesNo results for input(s): TROPONINI in the last 168 hours. No results for input(s): TROPIPOC in the last 168 hours.   Chemistry Recent Labs  Lab 07/15/20 0020 07/15/20 0026 07/15/20 0439  NA 139 142 138  K 3.9 3.9 4.2  CL 109 110 108  CO2 17*  --  19*  GLUCOSE 114* 112* 166*  BUN _1 CREATININE 1.00 0.80 0.96  CALCIUM 8.9  --  9.1  PROT 6.9  --   --   ALBUMIN 3.6  --   --   AST 13*  --   --   ALT 13  --   --   ALKPHOS 51  --   --   BILITOT 0.8  --   --   GFRNONAA >60  --  >60  ANIONGAP 13  --  11     Hematology Recent Labs  Lab 07/15/20 0020 07/15/20 0026 07/15/20 0439  WBC 11.5*  --  10.6*  RBC 4.44  --  4.25  HGB 13.6 14.3 13.0  HCT 40.5 42.0 39.0  MCV 91.2  --  91.8  MCH 30.6  --  30.6  MCHC 33.6  --  33.3  RDW 13.4  --  13.5  PLT 344  --  327    BNPNo results for input(s): BNP, PROBNP in the last 168 hours.   DDimer No results for input(s): DDIMER in the last 168 hours.   Radiology    CT Angio Chest PE W and/or Wo Contrast  Result Date: 07/15/2020 CLINICAL DATA:  Chest pain, dyspnea EXAM: CT ANGIOGRAPHY CHEST WITH CONTRAST TECHNIQUE: Multidetector CT imaging of the chest was performed using the standard protocol during bolus administration of intravenous contrast. Multiplanar CT image reconstructions and MIPs were obtained to evaluate the vascular anatomy. CONTRAST:  OMNIPAQUE IOHEXOL 350 MG/ML SOLN COMPARISON:  None. FINDINGS:  Cardiovascular: There is excellent opacification of the a pulmonary arterial tree. No intraluminal filling defect identified to suggest acute pulmonary embolism. The central pulmonary arteries are of normal caliber. Global cardiac size is within normal limits. Moderate hypertrophy of the left ventricle, however, is noted. Trace pericardial effusion is present. The thoracic aorta is unremarkable. Bovine arch anatomy noted. Mediastinum/Nodes: Thyroid unremarkable. No pathologic thoracic adenopathy. Esophagus unremarkable. Lungs/Pleura: The lungs are symmetrically well expanded. There is a focal area of subpleural ground-glass pulmonary infiltrate within the a posterobasal right lower lobe, best seen on axial image # 96/6 which is nonspecific, but may represent the sequela of subacute trauma, remote infarction, or subacute to remote infection. The lungs are otherwise clear. No pneumothorax or pleural effusion. The central airways are widely patent. Upper Abdomen: Unremarkable Musculoskeletal: No acute bone abnormality. Review of the MIP images confirms the above findings. IMPRESSION: No pulmonary embolism. Moderate left ventricular hypertrophy. This could be better assessed with echocardiography. Tiny focus of subpleural ground-glass pulmonary infiltrate within the right lower lobe, nonspecific. Differential considerations are as listed above. Electronically Signed   By: Helyn Numbers MD   On: 07/15/2020 01:16   DG Chest Portable 1 View  Result Date: 07/15/2020 CLINICAL DATA:  Chest pain EXAM: PORTABLE CHEST 1 VIEW COMPARISON:  None. FINDINGS: The heart size and mediastinal contours are within normal limits. Both lungs are clear. The visualized skeletal structures are unremarkable. IMPRESSION: No active disease. Electronically Signed   By: Deatra Robinson M.D.   On: 07/15/2020 00:38    Cardiac Studies   pending  Patient Profile     55 y.o. female with HTN, prior adrenal gland issues, obesity who recently  tested positive 10 days ago with only mild symptoms with cough which has since resolved. She presented to [facility] with chest pain, arm pain, shoulder pain, found to have elevated troponin.  Assessment & Plan    1. Chest pain/palpitations - sinus tachycardia this admission with EKG as above, LVH with repol changes but possibly ST segment changes V1-V3  -hsTroponin [phone] - stat echo planned - lipids in process - patient seen at bedside with Dr. Cristal Deer - see further thoughts - per preliminary review, she recommends sending ESR, CRP, d-dimer (CTA r/o PE but helpful to trend given Covid infection)  2. Essential HTN - currently on NTG drip being titrated, and home lisinopril - home HCTZ/spironolactone on hold by medical team due to normal anion gap metabolic acidosis, consider resuming  spironolactone when felt appropriate given reported hx of adrenal issues - home metoprolol split up until 12.70m BID -> can consider titration due to tachycardia/BP  3. Recent Covid-19 infection - CT showing a tiny focus of subpleural groundglass pulmonary infiltrate within the right lower lobe, nonspecific but radiologist feels may represent sequela of subacute trauma, remote infarction, or subacute to remote infection. Lungs are otherwise clear on CT.  Not tachypneic or hypoxic - further management per IM  4. LVH - await formal echo report, question related to BP  For questions or updates, please contact CSt. JosephPlease consult [email or web address] for contact info under Cardiology/STEMI.  Signed, DCharlie Pitter PA-C 07/15/2020, 9:12 AM

## 2020-07-16 ENCOUNTER — Inpatient Hospital Stay (HOSPITAL_COMMUNITY): Payer: No Typology Code available for payment source

## 2020-07-16 DIAGNOSIS — R079 Chest pain, unspecified: Secondary | ICD-10-CM

## 2020-07-16 LAB — BASIC METABOLIC PANEL
Anion gap: 10 (ref 5–15)
BUN: 8 mg/dL (ref 6–20)
CO2: 19 mmol/L — ABNORMAL LOW (ref 22–32)
Calcium: 9.6 mg/dL (ref 8.9–10.3)
Chloride: 106 mmol/L (ref 98–111)
Creatinine, Ser: 0.83 mg/dL (ref 0.44–1.00)
GFR, Estimated: >60 mL/min (ref >60)
Glucose, Bld: 118 mg/dL — ABNORMAL HIGH (ref 70–99)
Potassium: 4.3 mmol/L (ref 3.5–5.1)
Sodium: 135 mmol/L (ref 135–145)

## 2020-07-16 LAB — CBC
HCT: 36.9 % (ref 36.0–46.0)
Hemoglobin: 12.8 g/dL (ref 12.0–15.0)
MCH: 31.5 pg (ref 26.0–34.0)
MCHC: 34.7 g/dL (ref 30.0–36.0)
MCV: 90.9 fL (ref 80.0–100.0)
Platelets: 295 10*3/uL (ref 150–400)
RBC: 4.06 MIL/uL (ref 3.87–5.11)
RDW: 13.5 % (ref 11.5–15.5)
WBC: 10.5 10*3/uL (ref 4.0–10.5)
nRBC: 0 % (ref 0.0–0.2)

## 2020-07-16 LAB — MRSA PCR SCREENING: MRSA by PCR: NEGATIVE

## 2020-07-16 LAB — HEPARIN LEVEL (UNFRACTIONATED): Heparin Unfractionated: 0.45 IU/mL (ref 0.30–0.70)

## 2020-07-16 MED ORDER — METOPROLOL TARTRATE 25 MG PO TABS
25.0000 mg | ORAL_TABLET | Freq: Every day | ORAL | Status: DC
  Administered 2020-07-16: 25 mg via ORAL
  Filled 2020-07-16: qty 1

## 2020-07-16 MED ORDER — TRAMADOL HCL 50 MG PO TABS
50.0000 mg | ORAL_TABLET | Freq: Four times a day (QID) | ORAL | Status: DC | PRN
  Administered 2020-07-16 (×2): 50 mg via ORAL
  Filled 2020-07-16 (×2): qty 1

## 2020-07-16 MED ORDER — AMLODIPINE BESYLATE 10 MG PO TABS
10.0000 mg | ORAL_TABLET | Freq: Every day | ORAL | Status: DC
Start: 2020-07-16 — End: 2020-07-17
  Administered 2020-07-16 – 2020-07-17 (×2): 10 mg via ORAL
  Filled 2020-07-16 (×2): qty 1

## 2020-07-16 MED ORDER — PANTOPRAZOLE SODIUM 40 MG PO TBEC
40.0000 mg | DELAYED_RELEASE_TABLET | Freq: Every day | ORAL | Status: DC
  Administered 2020-07-16 – 2020-07-17 (×2): 40 mg via ORAL
  Filled 2020-07-16 (×2): qty 1

## 2020-07-16 MED ORDER — IOHEXOL 350 MG/ML SOLN
80.0000 mL | Freq: Once | INTRAVENOUS | Status: AC | PRN
  Administered 2020-07-16: 80 mL via INTRAVENOUS

## 2020-07-16 MED ORDER — METOPROLOL TARTRATE 50 MG PO TABS
50.0000 mg | ORAL_TABLET | Freq: Once | ORAL | Status: AC
  Administered 2020-07-16: 50 mg via ORAL
  Filled 2020-07-16: qty 1

## 2020-07-16 NOTE — Progress Notes (Signed)
ANTICOAGULATION CONSULT NOTE  Pharmacy Consult:  Heparin Indication: chest pain/ACS  Allergies  Allergen Reactions  . Ibuprofen Other (See Comments)    Causes increase in BP    Patient Measurements: Height: 5\' 4"  (162.6 cm) Weight: 90.7 kg (200 lb) IBW/kg (Calculated) : 54.7 Heparin Dosing Weight: 75.1 kg  Vital Signs: Temp: 98.7 F (37.1 C) (01/26 0749) Temp Source: Oral (01/26 0749) BP: 136/94 (01/26 1100) Pulse Rate: 90 (01/26 1100)  Labs: Recent Labs    07/15/20 0020 07/15/20 0026 07/15/20 0207 07/15/20 0439 07/15/20 6301 07/15/20 0850 07/15/20 0850 07/15/20 1054 07/15/20 1350 07/15/20 2057 07/16/20 0536  HGB 13.6 14.3  --  13.0  --   --   --   --   --   --  12.8  HCT 40.5 42.0  --  39.0  --   --   --   --   --   --  36.9  PLT 344  --   --  327  --   --   --   --   --   --  295  HEPARINUNFRC  --   --   --   --   --  >2.20*   < > 0.40  --  0.40 0.45  CREATININE 1.00 0.80  --  0.96  --   --   --   --   --   --  0.83  TROPONINIHS 104*  --    < >  --  153* 156*  --   --  149*  --   --    < > = values in this interval not displayed.    Estimated Creatinine Clearance: 85.5 mL/min (by C-G formula based on SCr of 0.83 mg/dL).   Assessment: 54 YOF on IV heparin for chest pain.  Plans noted for cardiac CT and 48 hrs of heparin if no etiology found.  -heparin level at goal -CBC stable   Goal of Therapy:  Heparin level 0.3-0.7 units/ml Monitor platelets by anticoagulation protocol: Yes   Plan:  Continue IV heparin at 1100 units/hr  Daily heparin level and CBC

## 2020-07-16 NOTE — Progress Notes (Signed)
Brief cardiology progress note: I personally read her CT cardiac  Aorta is normal caliber, 8mm at PA bifurcation Likely small PFO, incidental Normal LAA, pulmonary veins  Calcium score is 3 Right dominant coronary circulation Left main is patent, without plaque or stenosis LAD has very small area of noncalcified plaque at takeoff of D1, 1-24% stenosis. No other significant plaque or stenosis. Distal LAD wraps apex 2 diagonal branches without disease. LCx is without significant plaque or stenosis. 2 large OM branches.  Overall minimial nonobstructive CAD, cadrads 1 No evidence of obstructive disease  Given this, episode is unlikely to be ACS. Would continue aspirin and statin. Will have her follow up with me in the office. Cardiology will sign off at this time.  Buford Dresser, MD, PhD, [place]  [address] [place], [place] [address] [phone]

## 2020-07-16 NOTE — Progress Notes (Signed)
Spoke with Bethena Midget, Communicable Disease Nurse at [facility] Department. They have no record of a previous positive test reported to them.

## 2020-07-16 NOTE — Progress Notes (Signed)
Patient stated that she had positive test for Covid at 1/17 last Monday at [facility]. Contacted to [facility] and got from the record, the test day was 1/20. Explained patient to stay isolation until 10 days passing from 1/20. Patient understood it. HS [employer]

## 2020-07-16 NOTE — Progress Notes (Signed)
Progress Note  Patient Name: Tracy Decker Date of Encounter: 07/16/2020  Primary Cardiologist: New, Dr. Harrell Gave  Subjective   Had intermittent episodes overnight of focal pain under her left breast. Less severe than prior. Awaiting CT this AM for further evaluation. We had an extensive conversation today about both cardiac and non cardiac causes of chest pain. Also reviewed test results thus far. All questions answered.  Inpatient Medications    Scheduled Meds: . aspirin EC  81 mg Oral Daily  . lisinopril  40 mg Oral Daily  . megestrol  40 mg Oral BID  . pantoprazole  40 mg Oral Daily  . rosuvastatin  40 mg Oral Daily   Continuous Infusions: . heparin 1,100 Units/hr (07/15/20 1856)  . nitroGLYCERIN 35 mcg/min (07/16/20 1008)   PRN Meds: acetaminophen, morphine injection, ondansetron (ZOFRAN) IV, traMADol   Vital Signs    Vitals:   07/16/20 0749 07/16/20 1004 07/16/20 1049 07/16/20 1100  BP: 136/90  (!) 150/88 (!) 136/94  Pulse: 86 90  90  Resp: 15   (!) 21  Temp: 98.7 F (37.1 C)     TempSrc: Oral     SpO2: 95%   98%  Weight:      Height:        Intake/Output Summary (Last 24 hours) at 07/16/2020 1118 Last data filed at 07/16/2020 0800 Gross per 24 hour  Intake 882.68 ml  Output --  Net 882.68 ml   Last 3 Weights 07/15/2020 04/16/2020 11/05/2019  Weight (lbs) 200 lb 201 lb 1.3 oz 190 lb  Weight (kg) 90.719 kg 91.209 kg 86.183 kg     Telemetry    NSR - Personally Reviewed  ECG    07/15/20 NSR, unchanged ST-T wave pattern - Personally Reviewed  Physical Exam   GEN: Well nourished, well developed in no acute distress NECK: No JVD CARDIAC: regular rhythm, normal S1 and S2, no rubs or gallops. No murmur. VASCULAR: Radial pulses 2+ bilaterally.  RESPIRATORY:  Clear to auscultation without rales, wheezing or rhonchi  ABDOMEN: Soft, non-tender, non-distended MUSCULOSKELETAL:  Moves all 4 limbs independently SKIN: Warm and dry, no edema NEUROLOGIC:   No focal neuro deficits noted. PSYCHIATRIC:  Normal affect   Labs    High Sensitivity Troponin:   Recent Labs  Lab 07/15/20 0020 07/15/20 0207 07/15/20 0652 07/15/20 0850 07/15/20 1350  TROPONINIHS 104* 122* 153* 156* 149*      Cardiac EnzymesNo results for input(s): TROPONINI in the last 168 hours. No results for input(s): TROPIPOC in the last 168 hours.   Chemistry Recent Labs  Lab 07/15/20 0020 07/15/20 0026 07/15/20 0439 07/15/20 0850 07/16/20 0536  NA 139 142 138  --  135  K 3.9 3.9 4.2  --  4.3  CL 109 110 108  --  106  CO2 17*  --  19*  --  19*  GLUCOSE 114* 112* 166*  --  118*  BUN _0 --  8  CREATININE 1.00 0.80 0.96  --  0.83  CALCIUM 8.9  --  9.1  --  9.6  PROT 6.9  --   --  6.6  --   ALBUMIN 3.6  --   --  3.5  --   AST 13*  --   --  19  --   ALT 13  --   --  12  --   ALKPHOS 51  --   --  57  --   BILITOT 0.8  --   --  1.1  --   GFRNONAA >60  --  >60  --  >60  ANIONGAP 13  --  11  --  10     Hematology Recent Labs  Lab 07/15/20 0020 07/15/20 0026 07/15/20 0439 07/16/20 0536  WBC 11.5*  --  10.6* 10.5  RBC 4.44  --  4.25 4.06  HGB 13.6 14.3 13.0 12.8  HCT 40.5 42.0 39.0 36.9  MCV 91.2  --  91.8 90.9  MCH 30.6  --  30.6 31.5  MCHC 33.6  --  33.3 34.7  RDW 13.4  --  13.5 13.5  PLT 344  --  327 295    BNPNo results for input(s): BNP, PROBNP in the last 168 hours.   DDimer  Recent Labs  Lab 07/15/20 1054  DDIMER <0.27     Radiology    CT Angio Chest PE W and/or Wo Contrast  Result Date: 07/15/2020 CLINICAL DATA:  Chest pain, dyspnea EXAM: CT ANGIOGRAPHY CHEST WITH CONTRAST TECHNIQUE: Multidetector CT imaging of the chest was performed using the standard protocol during bolus administration of intravenous contrast. Multiplanar CT image reconstructions and MIPs were obtained to evaluate the vascular anatomy. CONTRAST:  110m OMNIPAQUE IOHEXOL 350 MG/ML SOLN COMPARISON:  None. FINDINGS: Cardiovascular: There is excellent  opacification of the a pulmonary arterial tree. No intraluminal filling defect identified to suggest acute pulmonary embolism. The central pulmonary arteries are of normal caliber. Global cardiac size is within normal limits. Moderate hypertrophy of the left ventricle, however, is noted. Trace pericardial effusion is present. The thoracic aorta is unremarkable. Bovine arch anatomy noted. Mediastinum/Nodes: Thyroid unremarkable. No pathologic thoracic adenopathy. Esophagus unremarkable. Lungs/Pleura: The lungs are symmetrically well expanded. There is a focal area of subpleural ground-glass pulmonary infiltrate within the a posterobasal right lower lobe, best seen on axial image # 96/6 which is nonspecific, but may represent the sequela of subacute trauma, remote infarction, or subacute to remote infection. The lungs are otherwise clear. No pneumothorax or pleural effusion. The central airways are widely patent. Upper Abdomen: Unremarkable Musculoskeletal: No acute bone abnormality. Review of the MIP images confirms the above findings. IMPRESSION: No pulmonary embolism. Moderate left ventricular hypertrophy. This could be better assessed with echocardiography. Tiny focus of subpleural ground-glass pulmonary infiltrate within the right lower lobe, nonspecific. Differential considerations are as listed above. Electronically Signed   By: AFidela SalisburyMD   On: 07/15/2020 01:16   DG Chest Portable 1 View  Result Date: 07/15/2020 CLINICAL DATA:  Chest pain EXAM: PORTABLE CHEST 1 VIEW COMPARISON:  None. FINDINGS: The heart size and mediastinal contours are within normal limits. Both lungs are clear. The visualized skeletal structures are unremarkable. IMPRESSION: No active disease. Electronically Signed   By: KUlyses JarredM.D.   On: 07/15/2020 00:38   ECHOCARDIOGRAM LIMITED  Result Date: 07/15/2020    ECHOCARDIOGRAM REPORT   Patient Name:   Tracy NEACEDate of Exam: 07/15/2020 Medical Rec #:  [id number]      Height:       64.0 in Accession #:    [id number]    Weight:       200.0 lb Date of Birth:  217-Sep-1967     BSA:          1.956 m Patient Age:    89years       BP:           153/93 mmHg Patient Gender: F  HR:           90 bpm. Exam Location:  Inpatient Procedure: Limited Echo, Limited Color Doppler and Cardiac Doppler Indications:    chest pain  History:        Patient has no prior history of Echocardiogram examinations.                 Covid., Signs/Symptoms:Chest Pain; Risk Factors:Hypertension.  Sonographer:    Johny Chess Referring Phys: [id number] [place]  1. Left ventricular ejection fraction, by estimation, is 70 to 75%. The left ventricle has hyperdynamic function. The left ventricle has no regional wall motion abnormalities. There is severe left ventricular hypertrophy. Left ventricular diastolic parameters are consistent with Grade I diastolic dysfunction (impaired relaxation). Elevated left atrial pressure.  2. Right ventricular systolic function is normal. The right ventricular size is normal.  3. A small pericardial effusion is present.  4. The mitral valve is normal in structure. Trivial mitral valve regurgitation. No evidence of mitral stenosis.  5. The aortic valve is tricuspid. Aortic valve regurgitation is not visualized. No aortic stenosis is present.  6. The inferior vena cava is normal in size with greater than 50% respiratory variability, suggesting right atrial pressure of 3 mmHg. FINDINGS  Left Ventricle: Left ventricular ejection fraction, by estimation, is 70 to 75%. The left ventricle has hyperdynamic function. The left ventricle has no regional wall motion abnormalities. The left ventricular internal cavity size was normal in size. There is severe left ventricular hypertrophy. Left ventricular diastolic parameters are consistent with Grade I diastolic dysfunction (impaired relaxation). Elevated left atrial pressure. Right Ventricle: The right  ventricular size is normal.Right ventricular systolic function is normal. Left Atrium: Left atrial size was normal in size. Right Atrium: Right atrial size was normal in size. Pericardium: A small pericardial effusion is present. Mitral Valve: The mitral valve is normal in structure. Trivial mitral valve regurgitation. No evidence of mitral valve stenosis. Tricuspid Valve: The tricuspid valve is normal in structure. Tricuspid valve regurgitation is trivial. No evidence of tricuspid stenosis. Aortic Valve: The aortic valve is tricuspid. Aortic valve regurgitation is not visualized. No aortic stenosis is present. Pulmonic Valve: The pulmonic valve was normal in structure. Pulmonic valve regurgitation is not visualized. No evidence of pulmonic stenosis. Aorta: The aortic root is normal in size and structure. Venous: The inferior vena cava is normal in size with greater than 50% respiratory variability, suggesting right atrial pressure of 3 mmHg. IAS/Shunts: No atrial level shunt detected by color flow Doppler.  LEFT VENTRICLE PLAX 2D LVIDd:         3.50 cm Diastology LVIDs:         2.30 cm LV e' medial:    4.90 cm/s LV PW:         1.70 cm LV E/e' medial:  17.0 LV IVS:        1.40 cm LV e' lateral:   6.53 cm/s                        LV E/e' lateral: 12.7  IVC IVC diam: 1.50 cm LEFT ATRIUM         Index LA diam:    3.80 cm 1.94 cm/m   AORTA Ao Asc diam: 2.60 cm MITRAL VALVE MV Area (PHT): 4.06 cm MV Decel Time: 187 msec MV E velocity: 83.10 cm/s MV A velocity: 114.00 cm/s MV E/A ratio:  0.73 Kirk Ruths MD Electronically signed by Kirk Ruths  MD Signature Date/Time: 07/15/2020/12:08:17 PM    Final     Cardiac Studies   Echo 07/15/20  1. Left ventricular ejection fraction, by estimation, is 70 to 75%. The  left ventricle has hyperdynamic function. The left ventricle has no  regional wall motion abnormalities. There is severe left ventricular  hypertrophy. Left ventricular diastolic  parameters are  consistent with Grade I diastolic dysfunction (impaired  relaxation). Elevated left atrial pressure.  2. Right ventricular systolic function is normal. The right ventricular  size is normal.  3. A small pericardial effusion is present.  4. The mitral valve is normal in structure. Trivial mitral valve  regurgitation. No evidence of mitral stenosis.  5. The aortic valve is tricuspid. Aortic valve regurgitation is not  visualized. No aortic stenosis is present.  6. The inferior vena cava is normal in size with greater than 50%  respiratory variability, suggesting right atrial pressure of 3 mmHg.   Patient Profile     55 y.o. female with HTN, prior adrenal gland issues, obesity who recently tested positive 10 days ago with only mild symptoms with cough which has since resolved. She presented to [facility] with chest pain, arm pain, shoulder pain, found to have elevated troponin.  Assessment & Plan    1. Chest pain/palpitations - sinus tachycardia this admission with EKG as above, nonspecific ST pattern that has remained unchanged  -hsTroponin [phone] -echo with hyperdynamic function, LVH -small effusion on echo, but normal CRP and ESR within range for age -with recent covid infection, differential includes pericarditis/myopericarditis. However, symptoms and labs not suggestive of this, though she does have a small effusion. PE ruled out with CT -we are planning for cardiac CT today. Working on heart rate control, given 50 mg metoprolol this AM. Will use nitro drip for vasodilation -would complete 48 hours of heparin if no clear etiology found -continue aspirin, statin -we did discuss noncardiac potential etiologies at length.  -her rhythm has been unremarkable here, but with palpitations something like SVT is possible, and this may cause slight Tn elevation.  2. 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 Consider restarting amlodipine as vasodilator as BP allows -lisinopril continued, but HCTZ and spironolactone on hold. Monitor renal function with contrast  3. Recent Covid-19 infection - CT showing a tiny focus of subpleural groundglass pulmonary infiltrate within the right lower lobe, nonspecific but radiologist feels may represent sequela of subacute trauma, remote infarction, or subacute to remote infection. Lungs are otherwise clear on CT.  Not tachypneic or hypoxic - further management per IM  Total time of encounter: 40 minutes total time of encounter, including >50% of time spent in face-to-face patient care. This time includes coordination of care and counseling regarding results, workup, and next steps. Remainder of non-face-to-face time involved reviewing chart documents/testing relevant to the patient encounter and documentation in the medical record.  Buford Dresser, MD, PhD, [place]   For questions or updates, please contact [facility] Please consult [email or web address] for contact info under Cardiology/STEMI.  Signed, Bridgette  Harrell Gave, MD 07/16/2020, 11:18 AM

## 2020-07-16 NOTE — Progress Notes (Signed)
PROGRESS NOTE    Tracy Decker  [id number] DOB: 08/25/1965 DOA: 07/15/2020 PCP: [name], NP   Brief Narrative: 55 year old with past medical history significant for hypertension, obesity BMI 34% complaining of chest pain.  Patient reports that she wake up with chest pain, severe 9 out of 10 sharp in quality.  Pain radiated to her jaw and left arm.  She felt amputation. She tested positive for Covid 10 days prior to admission.  She had mild symptoms when she was diagnosed with Covid, sore throat and sinus.  Evaluation in the ED she was tachycardic, EKG showing some ST changes lateral lead, chest x-ray no active disease. CT angio chest negative for PE Cardiology was consulted due to concern for myopericarditis.   Assessment & Plan:   Principal Problem:   Chest pain Active Problems:   Sinus tachycardia   History of [id number]   Metabolic acidosis   Essential hypertension   1-Chest pain, elevation of troponin Could be Related to myopericarditis. Cardiology with less concern for acute coronary syndrome Currently on IV nitroglycerin and heparin drip for 48 hours IV morphine ordered this morning due to recurrence of left arm pain and jaw pain,.  Started on aspirin and statins.  Coronary CT results pending.   Sinus tachycardia: Resume metoprolol CT angio negative for PE TSH normal.   Recent COVID-19 viral infection: Patient symptoms started 1/17. Her test was 1/20. No indication for treatment at this time. No hypoxemia.  PCR still positive.  Continue isolation.   Mild leukocytosis: Follow trend  Normal anion gap metabolic acidosis: Related to use of diuretics. NSL fluids.   Hypertension: Nitroglycerin drip, improved on lisinopril.  Reflux; started protonix.   Estimated body mass index is 34.33 kg/m as calculated from the following:   Height as of this encounter: 5\' 4"  (1.626 m).   Weight as of this encounter: 90.7 kg.   DVT prophylaxis: Heparin  Code  Status: Full code Family Communication:n patient , mother 1/25 Disposition Plan:  Status is: Inpatient  Remains inpatient appropriate because:Ongoing diagnostic testing needed not appropriate for outpatient work up   Dispo: The patient is from: Home              Anticipated d/c is to: Home              Anticipated d/c date is: 2 days              Patient currently is not medically stable to d/c.   Difficult to place patient No        Consultants:   Cardiology   Procedures:   ECHO; Ef 75 %  Antimicrobials:    Subjective: She denies chest pain during my evaluation. Had mild episode earlier.  Report reflux.   Objective: Vitals:   07/15/20 0010 07/16/20 0000 07/16/20 0325 07/16/20 0749  BP:  133/84 133/86 136/90  Pulse:  82 90 86  Resp:  14 16 15   Temp:  98.4 F (36.9 C) 98.5 F (36.9 C) 98.7 F (37.1 C)  TempSrc:  Oral Oral Oral  SpO2:  97% 97% 95%  Weight: 90.7 kg     Height: 5\' 4"  (1.626 m)       Intake/Output Summary (Last 24 hours) at 07/16/2020 0835 Last data filed at 07/15/2020 2200 Gross per 24 hour  Intake 240 ml  Output -  Net 240 ml   Filed Weights   07/15/20 0010  Weight: 90.7 kg    Examination:  General exam: NAD  Respiratory system: CTA. Cardiovascular system: S 1, S 2 RRR Gastrointestinal system: BS present, soft, nt Central nervous system: alert Extremities: no edema   Data Reviewed: I have personally reviewed following labs and imaging studies  CBC: Recent Labs  Lab 07/15/20 0020 07/15/20 0026 07/15/20 0439 07/16/20 0536  WBC 11.5*  --  10.6* 10.5  NEUTROABS 6.6  --   --   --   HGB 13.6 14.3 13.0 12.8  HCT 40.5 42.0 39.0 36.9  MCV 91.2  --  91.8 90.9  PLT 344  --  327 308   Basic Metabolic Panel: Recent Labs  Lab 07/15/20 0020 07/15/20 0026 07/15/20 0439 07/16/20 0536  NA 139 142 138 135  K 3.9 3.9 4.2 4.3  CL 109 110 108 106  CO2 17*  --  19* 19*  GLUCOSE 114* 112* 166* 118*  BUN 17 19 14 8   CREATININE  1.00 0.80 0.96 0.83  CALCIUM 8.9  --  9.1 9.6   GFR: Estimated Creatinine Clearance: 85.5 mL/min (by C-G formula based on SCr of 0.83 mg/dL). Liver Function Tests: Recent Labs  Lab 07/15/20 0020 07/15/20 0850  AST 13* 19  ALT 13 12  ALKPHOS 51 57  BILITOT 0.8 1.1  PROT 6.9 6.6  ALBUMIN 3.6 3.5   No results for input(s): LIPASE, AMYLASE in the last 168 hours. No results for input(s): AMMONIA in the last 168 hours. Coagulation Profile: No results for input(s): INR, PROTIME in the last 168 hours. Cardiac Enzymes: No results for input(s): CKTOTAL, CKMB, CKMBINDEX, TROPONINI in the last 168 hours. BNP (last 3 results) No results for input(s): PROBNP in the last 8760 hours. HbA1C: No results for input(s): HGBA1C in the last 72 hours. CBG: No results for input(s): GLUCAP in the last 168 hours. Lipid Profile: Recent Labs    07/15/20 0850  CHOL 183  HDL 35*  LDLCALC 120*  TRIG 141  CHOLHDL 5.2   Thyroid Function Tests: Recent Labs    07/15/20 0439  TSH 1.994   Anemia Panel: No results for input(s): VITAMINB12, FOLATE, FERRITIN, TIBC, IRON, RETICCTPCT in the last 72 hours. Sepsis Labs: No results for input(s): PROCALCITON, LATICACIDVEN in the last 168 hours.  Recent Results (from the past 240 hour(s))  SARS Coronavirus 2 by RT PCR (hospital order, performed in [facility] hospital lab) Nasopharyngeal Nasopharyngeal Swab     Status: Abnormal   Collection Time: 07/15/20  2:31 AM   Specimen: Nasopharyngeal Swab  Result Value Ref Range Status   SARS Coronavirus 2 POSITIVE (A) NEGATIVE Final    Comment: RESULT CALLED TO, READ BACK BY AND VERIFIED WITH: Nolon Stalls 0350 07/15/2020 T. TYSOR (NOTE) SARS-CoV-2 target nucleic acids are DETECTED  SARS-CoV-2 RNA is generally detectable in upper respiratory specimens  during the acute phase of infection.  Positive results are indicative  of the presence of the identified virus, but do not rule out bacterial infection or  co-infection with other pathogens not detected by the test.  Clinical correlation with patient history and  other diagnostic information is necessary to determine patient infection status.  The expected result is negative.  Fact Sheet for Patients:   StrictlyIdeas.no   Fact Sheet for Healthcare Providers:   BankingDealers.co.za    This test is not yet approved or cleared by the [place] FDA and  has been authorized for detection and/or diagnosis of SARS-CoV-2 by FDA under an Emergency Use Authorization (EUA).  This EUA will remain in effect (meaning this  test  can be used) for the duration of  the COVID-19 declaration under Section 564(b)(1) of the Act, 21 U.S.C. section 360-bbb-3(b)(1), unless the authorization is terminated or revoked sooner.  Performed at [facility], [place] [address], [place], [place] [address]   MRSA PCR Screening     Status: None   Collection Time: 07/15/20 10:56 PM   Specimen: Nasopharyngeal  Result Value Ref Range Status   MRSA by PCR NEGATIVE NEGATIVE Final    Comment:        The GeneXpert MRSA Assay (FDA approved for NASAL specimens only), is one component of a comprehensive MRSA colonization surveillance program. It is not intended to diagnose MRSA infection nor to guide or monitor treatment for MRSA infections. Performed at [facility], [place] [address] [place], [place] [address]          Radiology Studies: CT Angio Chest PE W and/or Wo Contrast  Result Date: 07/15/2020 CLINICAL DATA:  Chest pain, dyspnea EXAM: CT ANGIOGRAPHY CHEST WITH CONTRAST TECHNIQUE: Multidetector CT imaging of the chest was performed using the standard protocol during bolus administration of intravenous contrast. Multiplanar CT image reconstructions and MIPs were obtained to evaluate the vascular anatomy. CONTRAST:  136mL OMNIPAQUE IOHEXOL 350 MG/ML SOLN COMPARISON:  None. FINDINGS: Cardiovascular: There  is excellent opacification of the a pulmonary arterial tree. No intraluminal filling defect identified to suggest acute pulmonary embolism. The central pulmonary arteries are of normal caliber. Global cardiac size is within normal limits. Moderate hypertrophy of the left ventricle, however, is noted. Trace pericardial effusion is present. The thoracic aorta is unremarkable. Bovine arch anatomy noted. Mediastinum/Nodes: Thyroid unremarkable. No pathologic thoracic adenopathy. Esophagus unremarkable. Lungs/Pleura: The lungs are symmetrically well expanded. There is a focal area of subpleural ground-glass pulmonary infiltrate within the a posterobasal right lower lobe, best seen on axial image # 96/6 which is nonspecific, but may represent the sequela of subacute trauma, remote infarction, or subacute to remote infection. The lungs are otherwise clear. No pneumothorax or pleural effusion. The central airways are widely patent. Upper Abdomen: Unremarkable Musculoskeletal: No acute bone abnormality. Review of the MIP images confirms the above findings. IMPRESSION: No pulmonary embolism. Moderate left ventricular hypertrophy. This could be better assessed with echocardiography. Tiny focus of subpleural ground-glass pulmonary infiltrate within the right lower lobe, nonspecific. Differential considerations are as listed above. Electronically Signed   By: Fidela Salisbury MD   On: 07/15/2020 01:16   DG Chest Portable 1 View  Result Date: 07/15/2020 CLINICAL DATA:  Chest pain EXAM: PORTABLE CHEST 1 VIEW COMPARISON:  None. FINDINGS: The heart size and mediastinal contours are within normal limits. Both lungs are clear. The visualized skeletal structures are unremarkable. IMPRESSION: No active disease. Electronically Signed   By: Ulyses Jarred M.D.   On: 07/15/2020 00:38   ECHOCARDIOGRAM LIMITED  Result Date: 07/15/2020    ECHOCARDIOGRAM REPORT   Patient Name:   Tracy Decker Date of Exam: 07/15/2020 Medical Rec #:   [id number]      Height:       64.0 in Accession #:    [id number]     Weight:       200.0 lb Date of Birth:  [birth year]      BSA:          1.956 m Patient Age:    27 years       BP:           153/93 mmHg Patient Gender: F  HR:           90 bpm. Exam Location:  Inpatient Procedure: Limited Echo, Limited Color Doppler and Cardiac Doppler Indications:    chest pain  History:        Patient has no prior history of Echocardiogram examinations.                 Covid., Signs/Symptoms:Chest Pain; Risk Factors:Hypertension.  Sonographer:    Delcie RochLauren Pennington Referring Phys: [id number] VASUNDHRA RATHORE IMPRESSIONS  1. Left ventricular ejection fraction, by estimation, is 70 to 75%. The left ventricle has hyperdynamic function. The left ventricle has no regional wall motion abnormalities. There is severe left ventricular hypertrophy. Left ventricular diastolic parameters are consistent with Grade I diastolic dysfunction (impaired relaxation). Elevated left atrial pressure.  2. Right ventricular systolic function is normal. The right ventricular size is normal.  3. A small pericardial effusion is present.  4. The mitral valve is normal in structure. Trivial mitral valve regurgitation. No evidence of mitral stenosis.  5. The aortic valve is tricuspid. Aortic valve regurgitation is not visualized. No aortic stenosis is present.  6. The inferior vena cava is normal in size with greater than 50% respiratory variability, suggesting right atrial pressure of 3 mmHg. FINDINGS  Left Ventricle: Left ventricular ejection fraction, by estimation, is 70 to 75%. The left ventricle has hyperdynamic function. The left ventricle has no regional wall motion abnormalities. The left ventricular internal cavity size was normal in size. There is severe left ventricular hypertrophy. Left ventricular diastolic parameters are consistent with Grade I diastolic dysfunction (impaired relaxation). Elevated left atrial pressure. Right Ventricle: The  right ventricular size is normal.Right ventricular systolic function is normal. Left Atrium: Left atrial size was normal in size. Right Atrium: Right atrial size was normal in size. Pericardium: A small pericardial effusion is present. Mitral Valve: The mitral valve is normal in structure. Trivial mitral valve regurgitation. No evidence of mitral valve stenosis. Tricuspid Valve: The tricuspid valve is normal in structure. Tricuspid valve regurgitation is trivial. No evidence of tricuspid stenosis. Aortic Valve: The aortic valve is tricuspid. Aortic valve regurgitation is not visualized. No aortic stenosis is present. Pulmonic Valve: The pulmonic valve was normal in structure. Pulmonic valve regurgitation is not visualized. No evidence of pulmonic stenosis. Aorta: The aortic root is normal in size and structure. Venous: The inferior vena cava is normal in size with greater than 50% respiratory variability, suggesting right atrial pressure of 3 mmHg. IAS/Shunts: No atrial level shunt detected by color flow Doppler.  LEFT VENTRICLE PLAX 2D LVIDd:         3.50 cm Diastology LVIDs:         2.30 cm LV e' medial:    4.90 cm/s LV PW:         1.70 cm LV E/e' medial:  17.0 LV IVS:        1.40 cm LV e' lateral:   6.53 cm/s                        LV E/e' lateral: 12.7  IVC IVC diam: 1.50 cm LEFT ATRIUM         Index LA diam:    3.80 cm 1.94 cm/m   AORTA Ao Asc diam: 2.60 cm MITRAL VALVE MV Area (PHT): 4.06 cm MV Decel Time: 187 msec MV E velocity: 83.10 cm/s MV A velocity: 114.00 cm/s MV E/A ratio:  0.73 Olga MillersBrian Crenshaw MD Electronically signed by Olga MillersBrian Crenshaw  MD Signature Date/Time: 07/15/2020/12:08:17 PM    Final         Scheduled Meds: . aspirin EC  81 mg Oral Daily  . lisinopril  40 mg Oral Daily  . megestrol  40 mg Oral BID  . metoprolol tartrate  12.5 mg Oral BID  . rosuvastatin  40 mg Oral Daily   Continuous Infusions: . heparin 1,100 Units/hr (07/15/20 1856)  . nitroGLYCERIN 40 mcg/min (07/15/20 2155)      LOS: 1 day    Time spent: 35 minutes    Noga Fogg A Renleigh Ouellet, MD [facility]   If 7PM-7AM, please contact night-coverage [email or web address]  07/16/2020, 8:35 AM

## 2020-07-17 LAB — CBC
HCT: 37.1 % (ref 36.0–46.0)
Hemoglobin: 13.1 g/dL (ref 12.0–15.0)
MCH: 31.8 pg (ref 26.0–34.0)
MCHC: 35.3 g/dL (ref 30.0–36.0)
MCV: 90 fL (ref 80.0–100.0)
Platelets: 297 10*3/uL (ref 150–400)
RBC: 4.12 MIL/uL (ref 3.87–5.11)
RDW: 13.3 % (ref 11.5–15.5)
WBC: 10.4 10*3/uL (ref 4.0–10.5)
nRBC: 0 % (ref 0.0–0.2)

## 2020-07-17 MED ORDER — ASPIRIN 81 MG PO TBEC: 81.0000 mg | DELAYED_RELEASE_TABLET | Freq: Every day | ORAL | 11 refills | Status: DC

## 2020-07-17 MED ORDER — PANTOPRAZOLE SODIUM 40 MG PO TBEC: 40.0000 mg | DELAYED_RELEASE_TABLET | Freq: Every day | ORAL | 0 refills | Status: AC

## 2020-07-17 MED ORDER — ROSUVASTATIN CALCIUM 40 MG PO TABS: 40.0000 mg | ORAL_TABLET | Freq: Every day | ORAL | 0 refills | Status: DC

## 2020-07-17 MED ORDER — SPIRONOLACTONE 25 MG PO TABS
50.0000 mg | ORAL_TABLET | Freq: Every day | ORAL | Status: DC
Start: 2020-07-17 — End: 2020-07-17
  Administered 2020-07-17: 50 mg via ORAL
  Filled 2020-07-17: qty 2

## 2020-07-17 NOTE — Plan of Care (Signed)

## 2020-07-17 NOTE — Discharge Summary (Signed)
Physician Discharge Summary  Tracy Decker [id number] DOB: 07/29/1965 DOA: 07/15/2020  PCP: [name], NP  Admit date: 07/15/2020 Discharge date: 07/17/2020  Admitted From: Home  Disposition:  Home   Recommendations for Outpatient Follow-up:  1. Follow up with PCP in 1-2 weeks 2. Please obtain BMP/CBC in one week 3. Follow up with cardiology    Home Health: none  Discharge Condition: Stable.  CODE STATUS:Full code Diet recommendation: Heart Healthy    Brief/Interim Summary: 55 year old with past medical history significant for hypertension, obesity BMI 34% complaining of chest pain.  Patient reports that she wake up with chest pain, severe 9 out of 10 sharp in quality.  Pain radiated to her jaw and left arm.  She felt amputation. She tested positive for Covid 10 days prior to admission.  She had mild symptoms when she was diagnosed with Covid, sore throat and sinus.  Evaluation in the ED she was tachycardic, EKG showing some ST changes lateral lead, chest x-ray no active disease. CT angio chest negative for PE Cardiology was consulted due to concern for myopericarditis. Cardiology doesn't think patient has myopericarditis. CT coronary with minimal CAD.    1-Chest pain, elevation of troponin Cardiology is not  concern with  acute coronary syndrome or Myopericarditis.  Treated initially with  IV nitroglycerin and heparin drip for 48 hours. Started on aspirin and statins.  Coronary CT with minimal CAD.  Chest pain resolved. Non cardiac cause of chest pain.  Discharge on aspirin and statins.   Sinus tachycardia: Resume metoprolol CT angio negative for PE TSH normal.   Recent COVID-19 viral infection: Patient symptoms started 1/17. Her test was 1/20. No indication for treatment at this time. No hypoxemia.  PCR still positive.  Continue isolation for total 10 days from date of test.   Mild leukocytosis: Follow trend  Normal anion gap metabolic acidosis:  Related to use of diuretics. NSL fluids.   Hypertension: Nitroglycerin drip, improved on lisinopril.  Reflux; started protonix.   Discharge Diagnoses:  Principal Problem:   Chest pain Active Problems:   Sinus tachycardia   History of [id number]   Metabolic acidosis   Essential hypertension    Discharge Instructions  Discharge Instructions    Diet - low sodium heart healthy   Complete by: As directed    Increase activity slowly   Complete by: As directed      Allergies as of 07/17/2020      Reactions   Ibuprofen Other (See Comments)   Causes increase in BP      Medication List    TAKE these medications   amLODipine 10 MG tablet Commonly known as: NORVASC Take 10 mg by mouth daily.   aspirin 81 MG EC tablet Take 1 tablet (81 mg total) by mouth daily. Swallow whole. Start taking on: July 18, 2020   Fluocinolone Acetonide Scalp 0.01 % Oil Apply 1 application topically daily as needed for irritation.   hydrochlorothiazide 12.5 MG capsule Commonly known as: MICROZIDE Take 12.5 mg by mouth daily.   lisinopril 40 MG tablet Commonly known as: ZESTRIL Take 40 mg by mouth daily.   megestrol 40 MG tablet Commonly known as: MEGACE Take 1 tablet (40 mg total) by mouth 2 (two) times daily. Can increase to two tablets twice a day in the event of heavy bleeding   metoprolol tartrate 25 MG tablet Commonly known as: LOPRESSOR Take 25 mg by mouth at bedtime.   minoxidil 2.5 MG tablet Commonly known as: LONITEN Take  5 mg by mouth daily.   pantoprazole 40 MG tablet Commonly known as: PROTONIX Take 1 tablet (40 mg total) by mouth daily. Start taking on: July 18, 2020   rosuvastatin 40 MG tablet Commonly known as: CRESTOR Take 1 tablet (40 mg total) by mouth daily. Start taking on: July 18, 2020   spironolactone 50 MG tablet Commonly known as: ALDACTONE Take 50 mg by mouth daily.   vitamin D3 50 MCG (2000 UT) Caps Take 2,000 Units by mouth daily.    zolpidem 10 MG tablet Commonly known as: AMBIEN Take 10 mg by mouth at bedtime as needed for sleep.       Follow-up Information    [name], NP Follow up in 1 week(s).   Specialty: Nurse Practitioner Contact information: [place] [address] [phone]        Buford Dresser, MD .   Specialty: Cardiology Contact information: [address] [place] [address] [phone]              Allergies  Allergen Reactions  . Ibuprofen Other (See Comments)    Causes increase in BP    Consultations: Cardiology   Procedures/Studies: CT Angio Chest PE W and/or Wo Contrast  Result Date: 07/15/2020 CLINICAL DATA:  Chest pain, dyspnea EXAM: CT ANGIOGRAPHY CHEST WITH CONTRAST TECHNIQUE: Multidetector CT imaging of the chest was performed using the standard protocol during bolus administration of intravenous contrast. Multiplanar CT image reconstructions and MIPs were obtained to evaluate the vascular anatomy. CONTRAST:  151mL OMNIPAQUE IOHEXOL 350 MG/ML SOLN COMPARISON:  None. FINDINGS: Cardiovascular: There is excellent opacification of the a pulmonary arterial tree. No intraluminal filling defect identified to suggest acute pulmonary embolism. The central pulmonary arteries are of normal caliber. Global cardiac size is within normal limits. Moderate hypertrophy of the left ventricle, however, is noted. Trace pericardial effusion is present. The thoracic aorta is unremarkable. Bovine arch anatomy noted. Mediastinum/Nodes: Thyroid unremarkable. No pathologic thoracic adenopathy. Esophagus unremarkable. Lungs/Pleura: The lungs are symmetrically well expanded. There is a focal area of subpleural ground-glass pulmonary infiltrate within the a posterobasal right lower lobe, best seen on axial image # 96/6 which is nonspecific, but may represent the sequela of subacute trauma, remote infarction, or subacute to remote infection. The lungs are  otherwise clear. No pneumothorax or pleural effusion. The central airways are widely patent. Upper Abdomen: Unremarkable Musculoskeletal: No acute bone abnormality. Review of the MIP images confirms the above findings. IMPRESSION: No pulmonary embolism. Moderate left ventricular hypertrophy. This could be better assessed with echocardiography. Tiny focus of subpleural ground-glass pulmonary infiltrate within the right lower lobe, nonspecific. Differential considerations are as listed above. Electronically Signed   By: Fidela Salisbury MD   On: 07/15/2020 01:16   CT CORONARY MORPH W/CTA COR W/SCORE W/CA W/CM &/OR WO/CM  Addendum Date: 07/17/2020   ADDENDUM REPORT: 07/17/2020 08:07 HISTORY: Chest pain/anginal equiv, ECGs or troponins abnormal EXAM: Cardiac/Coronary CT TECHNIQUE: The patient was scanned on a [employer]. PROTOCOL: A 120 kV prospective scan was triggered in the descending thoracic aorta at 111 HU's. Axial non-contrast 3 mm slices were carried out through the heart. The data set was analyzed on a dedicated work station and scored using the [employer]. Gantry rotation speed was 250 msecs and collimation was 0.6 mm. Beta blockade and 0.8 mg of sl NTG was given. The 3D data set was reconstructed in 5% intervals of 35-75% of the R-R cycle. Diastolic phases were analyzed  on a dedicated work station using MPR, MIP and VRT modes. The patient received 57mL OMNIPAQUE IOHEXOL 350 MG/ML SOLN of contrast. FINDINGS: Coronary calcium score: The patient's coronary artery calcium score is 3, which places the patient in the 83rd percentile. Coronary arteries: Normal coronary origins.  Right dominance. Right Coronary Artery: Normal caliber vessel, gives rise to PDA. No significant plaque or stenosis. Left Main Coronary Artery: Normal caliber vessel. No significant plaque or stenosis. Left Anterior Descending Coronary Artery: Normal caliber vessel. Very small area of noncalcified plaque at the takeoff of  D1, 1-24% stenosis. Gives rise to 2 diagonal branches without significant plaque or stenosis. Distal LAD wraps apex. Left Circumflex Artery: Normal caliber vessel. No significant plaque or stenosis. Gives rise to 2 large OM branches without significant plaque or stenosis. Aorta: Normal size, mm at the mid ascending aorta (level of the PA bifurcation) measured double oblique. Trivial calcifications, consistent with aortic atherosclerosis. No dissection. Aortic Valve: No calcifications. Trileaflet. Other findings: Normal pulmonary vein drainage into the left atrium. Normal left atrial appendage without a thrombus. Normal size of the pulmonary artery. Likely small PFO, incidental finding. IMPRESSION: 1.  Minimal nonobstructive CAD, CADRADS = 1. 2. Coronary calcium score of 3. This was 83rd percentile for age and sex matched control. 3. Normal coronary origin with right dominance. 4.  Incidental very small PFO. 5.  Trivial aortic atherosclerosis. Electronically Signed   By: Buford Dresser M.D.   On: 07/17/2020 08:07   Result Date: 07/17/2020 EXAM: OVER-READ INTERPRETATION  CT CHEST The following report is an over-read performed by radiologist Dr. Abigail Miyamoto of [facility], [place] on 07/16/2020. This over-read does not include interpretation of cardiac or coronary anatomy or pathology. The coronary CTA interpretation by the cardiologist is attached. COMPARISON:  07/15/2020 CTA chest. FINDINGS: Vascular: Aortic atherosclerosis. No central pulmonary embolism, on this non-dedicated study. Mediastinum/Nodes: No imaged thoracic adenopathy. Lungs/Pleura: Trace left pleural thickening. The right lower lobe pleural-based opacity is only partially imaged and was detailed on yesterday's exam. Upper Abdomen: Normal imaged portions of the liver, spleen. Musculoskeletal: No acute osseous abnormality. IMPRESSION: 1.  No acute findings in the imaged extracardiac chest. 2.  Aortic Atherosclerosis (ICD10-I70.0).  Electronically Signed: By: Abigail Miyamoto M.D. On: 07/16/2020 13:23   DG Chest Portable 1 View  Result Date: 07/15/2020 CLINICAL DATA:  Chest pain EXAM: PORTABLE CHEST 1 VIEW COMPARISON:  None. FINDINGS: The heart size and mediastinal contours are within normal limits. Both lungs are clear. The visualized skeletal structures are unremarkable. IMPRESSION: No active disease. Electronically Signed   By: Ulyses Jarred M.D.   On: 07/15/2020 00:38   ECHOCARDIOGRAM LIMITED  Result Date: 07/15/2020    ECHOCARDIOGRAM REPORT   Patient Name:   Tracy Decker Date of Exam: 07/15/2020 Medical Rec #:  [id number]      Height:       64.0 in Accession #:    [id number]     Weight:       200.0 lb Date of Birth:  September 25, 1965      BSA:          1.956 m Patient Age:    55 years       BP:           153/93 mmHg Patient Gender: F              HR:           90 bpm. Exam Location:  Inpatient Procedure: Limited Echo, Limited Color Doppler  and Cardiac Doppler Indications:    chest pain  History:        Patient has no prior history of Echocardiogram examinations.                 Covid., Signs/Symptoms:Chest Pain; Risk Factors:Hypertension.  Sonographer:    Johny Chess Referring Phys: [id number] [place]  1. Left ventricular ejection fraction, by estimation, is 70 to 75%. The left ventricle has hyperdynamic function. The left ventricle has no regional wall motion abnormalities. There is severe left ventricular hypertrophy. Left ventricular diastolic parameters are consistent with Grade I diastolic dysfunction (impaired relaxation). Elevated left atrial pressure.  2. Right ventricular systolic function is normal. The right ventricular size is normal.  3. A small pericardial effusion is present.  4. The mitral valve is normal in structure. Trivial mitral valve regurgitation. No evidence of mitral stenosis.  5. The aortic valve is tricuspid. Aortic valve regurgitation is not visualized. No aortic stenosis is present.  6.  The inferior vena cava is normal in size with greater than 50% respiratory variability, suggesting right atrial pressure of 3 mmHg. FINDINGS  Left Ventricle: Left ventricular ejection fraction, by estimation, is 70 to 75%. The left ventricle has hyperdynamic function. The left ventricle has no regional wall motion abnormalities. The left ventricular internal cavity size was normal in size. There is severe left ventricular hypertrophy. Left ventricular diastolic parameters are consistent with Grade I diastolic dysfunction (impaired relaxation). Elevated left atrial pressure. Right Ventricle: The right ventricular size is normal.Right ventricular systolic function is normal. Left Atrium: Left atrial size was normal in size. Right Atrium: Right atrial size was normal in size. Pericardium: A small pericardial effusion is present. Mitral Valve: The mitral valve is normal in structure. Trivial mitral valve regurgitation. No evidence of mitral valve stenosis. Tricuspid Valve: The tricuspid valve is normal in structure. Tricuspid valve regurgitation is trivial. No evidence of tricuspid stenosis. Aortic Valve: The aortic valve is tricuspid. Aortic valve regurgitation is not visualized. No aortic stenosis is present. Pulmonic Valve: The pulmonic valve was normal in structure. Pulmonic valve regurgitation is not visualized. No evidence of pulmonic stenosis. Aorta: The aortic root is normal in size and structure. Venous: The inferior vena cava is normal in size with greater than 50% respiratory variability, suggesting right atrial pressure of 3 mmHg. IAS/Shunts: No atrial level shunt detected by color flow Doppler.  LEFT VENTRICLE PLAX 2D LVIDd:         3.50 cm Diastology LVIDs:         2.30 cm LV e' medial:    4.90 cm/s LV PW:         1.70 cm LV E/e' medial:  17.0 LV IVS:        1.40 cm LV e' lateral:   6.53 cm/s                        LV E/e' lateral: 12.7  IVC IVC diam: 1.50 cm LEFT ATRIUM         Index LA diam:    3.80 cm  1.94 cm/m   AORTA Ao Asc diam: 2.60 cm MITRAL VALVE MV Area (PHT): 4.06 cm MV Decel Time: 187 msec MV E velocity: 83.10 cm/s MV A velocity: 114.00 cm/s MV E/A ratio:  0.73 Kirk Ruths MD Electronically signed by Kirk Ruths MD Signature Date/Time: 07/15/2020/12:08:17 PM    Final     Subjective: Chest pain free this am.   Discharge  Exam: Vitals:   07/17/20 0800 07/17/20 1148  BP: 135/85 127/82  Pulse: 72 83  Resp: 20 20  Temp: 98.5 F (36.9 C) 98.6 F (37 C)  SpO2: 97% 97%     General: Pt is alert, awake, not in acute distress Cardiovascular: RRR, S1/S2 +, no rubs, no gallops Respiratory: CTA bilaterally, no wheezing, no rhonchi Abdominal: Soft, NT, ND, bowel sounds + Extremities: no edema, no cyanosis    The results of significant diagnostics from this hospitalization (including imaging, microbiology, ancillary and laboratory) are listed below for reference.     Microbiology: Recent Results (from the past 240 hour(s))  SARS Coronavirus 2 by RT PCR (hospital order, performed in [facility] hospital lab) Nasopharyngeal Nasopharyngeal Swab     Status: Abnormal   Collection Time: 07/15/20  2:31 AM   Specimen: Nasopharyngeal Swab  Result Value Ref Range Status   SARS Coronavirus 2 POSITIVE (A) NEGATIVE Final    Comment: RESULT CALLED TO, READ BACK BY AND VERIFIED WITH: Nolon Stalls 0350 07/15/2020 T. TYSOR (NOTE) SARS-CoV-2 target nucleic acids are DETECTED  SARS-CoV-2 RNA is generally detectable in upper respiratory specimens  during the acute phase of infection.  Positive results are indicative  of the presence of the identified virus, but do not rule out bacterial infection or co-infection with other pathogens not detected by the test.  Clinical correlation with patient history and  other diagnostic information is necessary to determine patient infection status.  The expected result is negative.  Fact Sheet for Patients:    StrictlyIdeas.no   Fact Sheet for Healthcare Providers:   BankingDealers.co.za    This test is not yet approved or cleared by the [place] FDA and  has been authorized for detection and/or diagnosis of SARS-CoV-2 by FDA under an Emergency Use Authorization (EUA).  This EUA will remain in effect (meaning this  test can be used) for the duration of  the COVID-19 declaration under Section 564(b)(1) of the Act, 21 U.S.C. section 360-bbb-3(b)(1), unless the authorization is terminated or revoked sooner.  Performed at [facility], [place] [address], [place], [place] [address]   MRSA PCR Screening     Status: None   Collection Time: 07/15/20 10:56 PM   Specimen: Nasopharyngeal  Result Value Ref Range Status   MRSA by PCR NEGATIVE NEGATIVE Final    Comment:        The GeneXpert MRSA Assay (FDA approved for NASAL specimens only), is one component of a comprehensive MRSA colonization surveillance program. It is not intended to diagnose MRSA infection nor to guide or monitor treatment for MRSA infections. Performed at [facility], [place] [address] [place],  [address]      Labs: BNP (last 3 results) No results for input(s): BNP in the last 8760 hours. Basic Metabolic Panel: Recent Labs  Lab 07/15/20 0020 07/15/20 0026 07/15/20 0439 07/16/20 0536  NA 139 142 138 135  K 3.9 3.9 4.2 4.3  CL 109 110 108 106  CO2 17*  --  19* 19*  GLUCOSE 114* 112* 166* 118*  BUN 17 19 14 8   CREATININE 1.00 0.80 0.96 0.83  CALCIUM 8.9  --  9.1 9.6   Liver Function Tests: Recent Labs  Lab 07/15/20 0020 07/15/20 0850  AST 13* 19  ALT 13 12  ALKPHOS 51 57  BILITOT 0.8 1.1  PROT 6.9 6.6  ALBUMIN 3.6 3.5   No results for input(s): LIPASE, AMYLASE in the last 168 hours. No  results for input(s): AMMONIA in the last 168 hours. CBC: Recent Labs  Lab 07/15/20 0020 07/15/20 0026 07/15/20 0439 07/16/20 0536  07/17/20 0035  WBC 11.5*  --  10.6* 10.5 10.4  NEUTROABS 6.6  --   --   --   --   HGB 13.6 14.3 13.0 12.8 13.1  HCT 40.5 42.0 39.0 36.9 37.1  MCV 91.2  --  91.8 90.9 90.0  PLT 344  --  327 295 297   Cardiac Enzymes: No results for input(s): CKTOTAL, CKMB, CKMBINDEX, TROPONINI in the last 168 hours. BNP: Invalid input(s): POCBNP CBG: No results for input(s): GLUCAP in the last 168 hours. D-Dimer Recent Labs    07/15/20 1054  DDIMER <0.27   Hgb A1c No results for input(s): HGBA1C in the last 72 hours. Lipid Profile Recent Labs    07/15/20 0850  CHOL 183  HDL 35*  LDLCALC 120*  TRIG 141  CHOLHDL 5.2   Thyroid function studies Recent Labs    07/15/20 0439  TSH 1.994   Anemia work up No results for input(s): VITAMINB12, FOLATE, FERRITIN, TIBC, IRON, RETICCTPCT in the last 72 hours. Urinalysis No results found for: COLORURINE, APPEARANCEUR, [place], [place], [place], [place], [place], [place], PROTEINUR, UROBILINOGEN, NITRITE, LEUKOCYTESUR Sepsis Labs Invalid input(s): PROCALCITONIN,  WBC,  LACTICIDVEN Microbiology Recent Results (from the past 240 hour(s))  SARS Coronavirus 2 by RT PCR (hospital order, performed in [facility] hospital lab) Nasopharyngeal Nasopharyngeal Swab     Status: Abnormal   Collection Time: 07/15/20  2:31 AM   Specimen: Nasopharyngeal Swab  Result Value Ref Range Status   SARS Coronavirus 2 POSITIVE (A) NEGATIVE Final    Comment: RESULT CALLED TO, READ BACK BY AND VERIFIED WITH: Nolon Stalls 0350 07/15/2020 T. TYSOR (NOTE) SARS-CoV-2 target nucleic acids are DETECTED  SARS-CoV-2 RNA is generally detectable in upper respiratory specimens  during the acute phase of infection.  Positive results are indicative  of the presence of the identified virus, but do not rule out bacterial infection or co-infection with other pathogens not detected by the test.  Clinical correlation with patient history and  other diagnostic information is  necessary to determine patient infection status.  The expected result is negative.  Fact Sheet for Patients:   StrictlyIdeas.no   Fact Sheet for Healthcare Providers:   BankingDealers.co.za    This test is not yet approved or cleared by the [place] FDA and  has been authorized for detection and/or diagnosis of SARS-CoV-2 by FDA under an Emergency Use Authorization (EUA).  This EUA will remain in effect (meaning this  test can be used) for the duration of  the COVID-19 declaration under Section 564(b)(1) of the Act, 21 U.S.C. section 360-bbb-3(b)(1), unless the authorization is terminated or revoked sooner.  Performed at [facility], [place] [address], [place], [place] [address]   MRSA PCR Screening     Status: None   Collection Time: 07/15/20 10:56 PM   Specimen: Nasopharyngeal  Result Value Ref Range Status   MRSA by PCR NEGATIVE NEGATIVE Final    Comment:        The GeneXpert MRSA Assay (FDA approved for NASAL specimens only), is one component of a comprehensive MRSA colonization surveillance program. It is not intended to diagnose MRSA infection nor to guide or monitor treatment for MRSA infections. Performed at [facility], [place] [address], [place], [place] [address]      Time coordinating discharge: 40 minutes  SIGNED:   Jerald Kief  Desiree Lucy, MD  [facility]

## 2020-08-01 ENCOUNTER — Telehealth: Payer: Self-pay | Admitting: Cardiology

## 2020-08-01 NOTE — Telephone Encounter (Signed)
Pt c/o of Chest Pain: STAT if CP now or developed within 24 hours  1. Are you having CP right now? Yes  2. Are you experiencing any other symptoms (ex. SOB, nausea, vomiting, sweating)? No.  3. How long have you been experiencing CP? A few days.  4. Is your CP continuous or coming and going? Coming and going.  5. Have you taken Nitroglycerin? No.   Patient is calling stating that she is experiencing chest pains and would like to see if she can see Dr. Harrell Gave sooner than her appointment on 08/08/20. Please advise. ?

## 2020-08-01 NOTE — Telephone Encounter (Signed)
Spoke with pt who report since discharge from the hospital, she continue to experience chest pain. She state symptoms are similar to previous episode but not as severe. Instead of a stabbing feeling before, it feels like a thumb tack being pressed into her chest. Pt report she has had 2-3 episodes today but denies symptoms currently.  Appointment scheduled for 2/14 with Dr. Harrell Gave for further evaluations. Pt advised to report to ER if symptoms reoccur. Pt verbalized understanding.

## 2020-08-04 ENCOUNTER — Other Ambulatory Visit: Payer: Self-pay

## 2020-08-04 ENCOUNTER — Encounter: Payer: Self-pay | Admitting: Cardiology

## 2020-08-04 ENCOUNTER — Ambulatory Visit: Payer: No Typology Code available for payment source | Admitting: Cardiology

## 2020-08-04 VITALS — BP 132/90 | HR 78 | Ht 64.0 in | Wt 193.0 lb

## 2020-08-04 DIAGNOSIS — Z8616 Personal history of COVID-19: Secondary | ICD-10-CM

## 2020-08-04 DIAGNOSIS — Z7189 Other specified counseling: Secondary | ICD-10-CM

## 2020-08-04 DIAGNOSIS — R0789 Other chest pain: Secondary | ICD-10-CM

## 2020-08-04 DIAGNOSIS — I2510 Atherosclerotic heart disease of native coronary artery without angina pectoris: Secondary | ICD-10-CM

## 2020-08-04 DIAGNOSIS — I10 Essential (primary) hypertension: Secondary | ICD-10-CM

## 2020-08-04 NOTE — Progress Notes (Signed)
Cardiology Office Note:    Date:  08/04/2020   ID:  Tracy Decker, DOB 12/09/1965, MRN [id number]  PCP:  [name], NP  Cardiologist:  Buford Dresser, MD  Referring MD: [name], NP   CC: post hospital follow up, chest pain  History of Present Illness:    Tracy Decker is a 55 y.o. female with a hx of hypertension, prior adrenal issues, obesity, personal history of Covid who is seen for follow up for chest pain. I met her in the hospital 06/2020.  Hospitalization from 06/2020 reviewed. Noted to have mild, largely flat elevation in hsTnI (104, 122, 153). CT cardiac with Ca score of 3 and minimal nonobstructive CAD. Echo hyperdynamic, LVH noted. CRP/ESR normal for age.  Today: Continues to be dizzy, short of breath, and like someone sticks a thumbtack in her chest about 6 times a day. Not related to exertion or other clear factors. She has ben kept out of work due to her persistent pain.  We reviewed her hospital workup at length today. We looked at her CT cardiac pictures together. There is only trivial calcium without significant stenosis. Discussed role of aspirin/statin in this for long term prevention.  We discussed alternative causes of chest pain. She was Covid vaccinated and had only very mild respiratory symptoms with her infection. Did discuss potential for long term symptoms of unknown etiology with her.   Denies shortness of breath at rest or with normal exertion. No PND, orthopnea, LE edema or unexpected weight gain. No syncope or palpitations.  Past Medical History:  Diagnosis Date  . Hypertension   . Renal disorder     Past Surgical History:  Procedure Laterality Date  . ADRENAL GLAND SURGERY Bilateral     Current Medications: Current Outpatient Medications on File Prior to Visit  Medication Sig  . amLODipine (NORVASC) 10 MG tablet Take 10 mg by mouth daily.  Marland Kitchen aspirin EC 81 MG EC tablet Take 1 tablet (81 mg total) by mouth daily. Swallow  whole.  Marland Kitchen buPROPion (WELLBUTRIN) 100 MG tablet Take by mouth.  . Cholecalciferol (VITAMIN D3) 50 MCG (2000 UT) CAPS Take 2,000 Units by mouth daily.  Marland Kitchen escitalopram (LEXAPRO) 20 MG tablet Take by mouth.  . Fluocinolone Acetonide Scalp 0.01 % OIL Apply 1 application topically daily as needed for irritation.  . hydrochlorothiazide (MICROZIDE) 12.5 MG capsule Take 12.5 mg by mouth daily.  Marland Kitchen lisinopril (ZESTRIL) 40 MG tablet Take 40 mg by mouth daily.  . megestrol (MEGACE) 40 MG tablet Take 1 tablet (40 mg total) by mouth 2 (two) times daily. Can increase to two tablets twice a day in the event of heavy bleeding  . metoprolol succinate (TOPROL-XL) 50 MG 24 hr tablet Take by mouth.  . minoxidil (LONITEN) 2.5 MG tablet Take 5 mg by mouth daily.  . pantoprazole (PROTONIX) 40 MG tablet Take 1 tablet (40 mg total) by mouth daily.  . rosuvastatin (CRESTOR) 40 MG tablet Take 1 tablet (40 mg total) by mouth daily.  Marland Kitchen spironolactone (ALDACTONE) 50 MG tablet Take 50 mg by mouth daily.  . SUMAtriptan (IMITREX) 50 MG tablet Take by mouth.  . zolpidem (AMBIEN) 10 MG tablet Take 10 mg by mouth at bedtime as needed for sleep.   No current facility-administered medications on file prior to visit.     Allergies:   Ibuprofen   Social History   Tobacco Use  . Smoking status: Never Smoker  . Smokeless tobacco: Never Used  Substance Use  Topics  . Alcohol use: Not Currently  . Drug use: Never    Family History: family history includes Breast cancer in her maternal grandmother.  ROS:   Please see the history of present illness.  Additional pertinent ROS: Constitutional: Negative for chills, fever, night sweats, unintentional weight loss  HENT: Negative for ear pain and hearing loss.   Eyes: Negative for loss of vision and eye pain.  Respiratory: Negative for cough, sputum, wheezing.   Cardiovascular: See HPI. Gastrointestinal: Negative for abdominal pain, melena, and hematochezia.  Genitourinary:  Negative for dysuria and hematuria.  Musculoskeletal: Negative for falls and myalgias.  Skin: Negative for itching and rash.  Neurological: Negative for focal weakness, focal sensory changes and loss of consciousness.  Endo/Heme/Allergies: Does not bruise/bleed easily.     EKGs/Labs/Other Studies Reviewed:    The following studies were reviewed today: Echo 07/15/20 1. Left ventricular ejection fraction, by estimation, is 70 to 75%. The  left ventricle has hyperdynamic function. The left ventricle has no  regional wall motion abnormalities. There is severe left ventricular  hypertrophy. Left ventricular diastolic  parameters are consistent with Grade I diastolic dysfunction (impaired  relaxation). Elevated left atrial pressure.  2. Right ventricular systolic function is normal. The right ventricular  size is normal.  3. A small pericardial effusion is present.  4. The mitral valve is normal in structure. Trivial mitral valve  regurgitation. No evidence of mitral stenosis.  5. The aortic valve is tricuspid. Aortic valve regurgitation is not  visualized. No aortic stenosis is present.  6. The inferior vena cava is normal in size with greater than 50%  respiratory variability, suggesting right atrial pressure of 3 mmHg.   CT cardiac 07/16/20 FINDINGS: Coronary calcium score: The patient's coronary artery calcium score is 3, which places the patient in the 83rd percentile.  Coronary arteries: Normal coronary origins.  Right dominance.  Right Coronary Artery: Normal caliber vessel, gives rise to PDA. No significant plaque or stenosis.  Left Main Coronary Artery: Normal caliber vessel. No significant plaque or stenosis.  Left Anterior Descending Coronary Artery: Normal caliber vessel. Very small area of noncalcified plaque at the takeoff of D1, 1-24% stenosis. Gives rise to 2 diagonal branches without significant plaque or stenosis. Distal LAD wraps apex.  Left  Circumflex Artery: Normal caliber vessel. No significant plaque or stenosis. Gives rise to 2 large OM branches without significant plaque or stenosis.  Aorta: Normal size, mm at the mid ascending aorta (level of the PA bifurcation) measured double oblique. Trivial calcifications, consistent with aortic atherosclerosis. No dissection.  Aortic Valve: No calcifications. Trileaflet.  Other findings:  Normal pulmonary vein drainage into the left atrium.  Normal left atrial appendage without a thrombus.  Normal size of the pulmonary artery.  Likely small PFO, incidental finding.  IMPRESSION: 1.  Minimal nonobstructive CAD, CADRADS = 1.  2. Coronary calcium score of 3. This was 83rd percentile for age and sex matched control.  3. Normal coronary origin with right dominance.  4.  Incidental very small PFO.  5.  Trivial aortic atherosclerosis.  EKG:  EKG is personally reviewed.  The ekg ordered today demonstrates NSR at 78 bpm with ST-T wave changes in diffuse leads. This is similar to prior ECGs and suggestive of LVH with repolarization abnormality  Recent Labs: 07/15/2020: ALT 12; TSH 1.994 07/16/2020: BUN 8; Creatinine, Ser 0.83; Potassium 4.3; Sodium 135 07/17/2020: Hemoglobin 13.1; Platelets 297  Recent Lipid Panel    Component Value Date/Time   CHOL  183 07/15/2020 0850   TRIG 141 07/15/2020 0850   HDL 35 (L) 07/15/2020 0850   CHOLHDL 5.2 07/15/2020 0850   VLDL 28 07/15/2020 0850   LDLCALC 120 (H) 07/15/2020 0850    Physical Exam:    VS:  BP 132/90   Pulse 78   Ht 5\' 4"  (1.626 m)   Wt 193 lb (87.5 kg)   BMI 33.13 kg/m     Wt Readings from Last 3 Encounters:  08/04/20 193 lb (87.5 kg)  07/15/20 200 lb (90.7 kg)  04/16/20 201 lb 1.3 oz (91.2 kg)    GEN: Well nourished, well developed in no acute distress HEENT: Normal, moist mucous membranes NECK: No JVD CARDIAC: regular rhythm, normal S1 and S2, no rubs or gallops. No murmur. No tenderness to  palpation VASCULAR: Radial and DP pulses 2+ bilaterally. No carotid bruits RESPIRATORY:  Clear to auscultation without rales, wheezing or rhonchi  ABDOMEN: Soft, non-tender, non-distended MUSCULOSKELETAL:  Ambulates independently SKIN: Warm and dry, no edema NEUROLOGIC:  Alert and oriented x 3. No focal neuro deficits noted. PSYCHIATRIC:  Normal affect    ASSESSMENT:    1. Atypical chest pain   2. Essential hypertension   3. History of COVID-19   4. Nonocclusive coronary atherosclerosis of native coronary artery   5. Cardiac risk counseling   6. Counseling on health promotion and disease prevention    PLAN:    Atypical chest pain Personal history of Covid 40 -has had excellent cardiac workup.  -likely not cardiac in origin. Unclear cause, but as the onset is related to her Covid diagnosis, highly suspect they may be related. -while she did not have significant Covid respiratory symptoms, this does not mean she may not have a longer term period of recovery -inflammatory markers unremarkable, symptoms not typical for pericarditis -recommend discussing noncardiac causes of chest pain and options for management  Nonobstructive CAD -calcium score of 3, trivial nonobstructive CAD -on aspirin, statin for long term prevention  -reviewed her images today  Hypertension LVH on echo, LVH on ECG with repolarization abnormality -stressed importance of blood pressure control -much improved since adrenal surgery  -continue amlodipine, HCTZ, lisinopril, metoprolol, spironolactone  Cardiac risk counseling and prevention recommendations: -recommend heart healthy/Mediterranean diet, with whole grains, fruits, vegetable, fish, lean meats, nuts, and olive oil. Limit salt. -recommend moderate walking, 3-5 times/week for 30-50 minutes each session. Aim for at least 150 minutes.week. Goal should be pace of 3 miles/hours, or walking 1.5 miles in 30 minutes -recommend avoidance of tobacco products.  Avoid excess alcohol.  Plan for follow up: 1 year or sooner as needed  12, MD, PhD, [facility] [place]  [facility]    Medication Adjustments/Labs and Tests Ordered: Current medicines are reviewed at length with the patient today.  Concerns regarding medicines are outlined above.  Orders Placed This Encounter  Procedures  . EKG 12-Lead   No orders of the defined types were placed in this encounter.   Patient Instructions  Medication Instructions:  Your Physician recommend you continue on your current medication as directed.    *If you need a refill on your cardiac medications before your next appointment, please call your pharmacy*   Lab Work: None  Testing/Procedures: None   Follow-Up: At [facility], you and your health needs are our priority.  As part of our continuing mission to provide you with exceptional heart care, we have created designated Provider Care Teams.  These Care Teams include your primary Cardiologist (physician)  and Advanced Practice Providers (APPs -  Physician Assistants and Nurse Practitioners) who all work together to provide you with the care you need, when you need it.  We recommend signing up for the patient portal called "MyChart".  Sign up information is provided on this After Visit Summary.  MyChart is used to connect with patients for Virtual Visits (Telemedicine).  Patients are able to view lab/test results, encounter notes, upcoming appointments, etc.  Non-urgent messages can be sent to your provider as well.   To learn more about what you can do with MyChart, go to [email or web address].    Your next appointment:   1 year(s)  The format for your next appointment:   In Person  Provider:   Buford Dresser, MD       Signed, Buford Dresser, MD PhD 08/04/2020 5:13 PM    [place]

## 2020-08-04 NOTE — Patient Instructions (Signed)

## 2020-08-06 NOTE — Progress Notes (Deleted)
Cardiology Office Note   Date:  08/06/2020   ID:  Tracy Decker, DOB [birth year], MRN [id number]  PCP:  [name], NP  Cardiologist: Dr. Harrell Gave No chief complaint on file.    History of Present Illness: Tracy Decker is a 55 y.o. female who presents for ongoing assessment and management of hypertension prior adrenal issues, obesity, most recent office visit with Dr. Harrell Gave on 08/04/2020 for recurrent discomfort in her chest which she described as sharp feeling like "someone sticking a thumbtack in her chest".  Dr. Harrell Gave did not feel that this was cardiac pain.  She did have a cardiac CTA with a score of 3.  She was also found to have an incidental very small PFO.  Echocardiogram 07/15/2020 revealed LV systolic function, EF of 70 to 75%, with severe left ventricular hypertrophy.  Left ventricular diastolic parameters were consistent with grade 1 diastolic dysfunction with elevated left atrial pressure.  Dr. Harrell Gave reported that she had an excellent cardiac work-up and her discomfort in her chest was not likely cardiac in etiology.  Her inflammatory markers were unremarkable, and her symptoms were not typical for pericarditis.  She was to follow-up with PCP to evaluate noncardiac issues of her chest discomfort.  Blood pressure was controlled.  She was to continue amlodipine HCTZ and lisinopril metoprolol and spironolactone.  Past Medical History:  Diagnosis Date  . Hypertension   . Renal disorder     Past Surgical History:  Procedure Laterality Date  . ADRENAL GLAND SURGERY Bilateral      Current Outpatient Medications  Medication Sig Dispense Refill  . amLODipine (NORVASC) 10 MG tablet Take 10 mg by mouth daily.    Marland Kitchen aspirin EC 81 MG EC tablet Take 1 tablet (81 mg total) by mouth daily. Swallow whole. 30 tablet 11  . buPROPion (WELLBUTRIN) 100 MG tablet Take by mouth.    . Cholecalciferol (VITAMIN D3) 50 MCG (2000 UT) CAPS Take 2,000 Units by mouth daily.    Marland Kitchen  escitalopram (LEXAPRO) 20 MG tablet Take by mouth.    . Fluocinolone Acetonide Scalp 0.01 % OIL Apply 1 application topically daily as needed for irritation.    . hydrochlorothiazide (MICROZIDE) 12.5 MG capsule Take 12.5 mg by mouth daily.    Marland Kitchen lisinopril (ZESTRIL) 40 MG tablet Take 40 mg by mouth daily.    . megestrol (MEGACE) 40 MG tablet Take 1 tablet (40 mg total) by mouth 2 (two) times daily. Can increase to two tablets twice a day in the event of heavy bleeding 60 tablet 3  . metoprolol succinate (TOPROL-XL) 50 MG 24 hr tablet Take by mouth.    . minoxidil (LONITEN) 2.5 MG tablet Take 5 mg by mouth daily.    . pantoprazole (PROTONIX) 40 MG tablet Take 1 tablet (40 mg total) by mouth daily. 30 tablet 0  . rosuvastatin (CRESTOR) 40 MG tablet Take 1 tablet (40 mg total) by mouth daily. 30 tablet 0  . spironolactone (ALDACTONE) 50 MG tablet Take 50 mg by mouth daily.    . SUMAtriptan (IMITREX) 50 MG tablet Take by mouth.    . zolpidem (AMBIEN) 10 MG tablet Take 10 mg by mouth at bedtime as needed for sleep.     No current facility-administered medications for this visit.    Allergies:   Ibuprofen    Social History:  The patient  reports that she has never smoked. She has never used smokeless tobacco. She reports previous alcohol use. She reports that she does  not use drugs.   Family History:  The patient's family history includes Breast cancer in her maternal grandmother.    ROS: All other systems are reviewed and negative. Unless otherwise mentioned in H&P    PHYSICAL EXAM: VS:  There were no vitals taken for this visit. , BMI There is no height or weight on file to calculate BMI. GEN: Well nourished, well developed, in no acute distress HEENT: normal Neck: no JVD, carotid bruits, or masses Cardiac: ***RRR; no murmurs, rubs, or gallops,no edema  Respiratory:  Clear to auscultation bilaterally, normal work of breathing GI: soft, nontender, nondistended, + BS MS: no deformity or  atrophy Skin: warm and dry, no rash Neuro:  Strength and sensation are intact Psych: euthymic mood, full affect   EKG:  EKG {ACTION; IS/IS [id number]} ordered today. The ekg ordered today demonstrates ***   Recent Labs: 07/15/2020: ALT 12; TSH 1.994 07/16/2020: BUN 8; Creatinine, Ser 0.83; Potassium 4.3; Sodium 135 07/17/2020: Hemoglobin 13.1; Platelets 297    Lipid Panel    Component Value Date/Time   CHOL 183 07/15/2020 0850   TRIG 141 07/15/2020 0850   HDL 35 (L) 07/15/2020 0850   CHOLHDL 5.2 07/15/2020 0850   VLDL 28 07/15/2020 0850   LDLCALC 120 (H) 07/15/2020 0850      Wt Readings from Last 3 Encounters:  08/04/20 193 lb (87.5 kg)  07/15/20 200 lb (90.7 kg)  04/16/20 201 lb 1.3 oz (91.2 kg)      Other studies Reviewed: Echo 07/15/20 1. Left ventricular ejection fraction, by estimation, is 70 to 75%. The  left ventricle has hyperdynamic function. The left ventricle has no  regional wall motion abnormalities. There is severe left ventricular  hypertrophy. Left ventricular diastolic  parameters are consistent with Grade I diastolic dysfunction (impaired  relaxation). Elevated left atrial pressure.  2. Right ventricular systolic function is normal. The right ventricular  size is normal.  3. A small pericardial effusion is present.  4. The mitral valve is normal in structure. Trivial mitral valve  regurgitation. No evidence of mitral stenosis.  5. The aortic valve is tricuspid. Aortic valve regurgitation is not  visualized. No aortic stenosis is present.  6. The inferior vena cava is normal in size with greater than 50%  respiratory variability, suggesting right atrial pressure of 3 mmHg.   CT cardiac 07/16/20 FINDINGS: Coronary calcium score: The patient's coronary artery calcium score is 3, which places the patient in the 83rd percentile.  Coronary arteries: Normal coronary origins. Right dominance.  Right Coronary Artery: Normal caliber vessel,  gives rise to PDA. No significant plaque or stenosis.  Left Main Coronary Artery: Normal caliber vessel. No significant plaque or stenosis.  Left Anterior Descending Coronary Artery: Normal caliber vessel. Very small area of noncalcified plaque at the takeoff of D1, 1-24% stenosis. Gives rise to 2 diagonal branches without significant plaque or stenosis. Distal LAD wraps apex.  Left Circumflex Artery: Normal caliber vessel. No significant plaque or stenosis. Gives rise to 2 large OM branches without significant plaque or stenosis.  Aorta: Normal size, mm at the mid ascending aorta (level of the PA bifurcation) measured double oblique. Trivial calcifications, consistent with aortic atherosclerosis. No dissection.  Aortic Valve: No calcifications. Trileaflet.  Other findings:  Normal pulmonary vein drainage into the left atrium.  Normal left atrial appendage without a thrombus.  Normal size of the pulmonary artery.  Likely small PFO, incidental finding.  IMPRESSION: 1. Minimal nonobstructive CAD, CADRADS = 1.  2. Coronary  calcium score of 3. This was 83rd percentile for age and sex matched control.  3. Normal coronary origin with right dominance.  4. Incidental very small PFO.  5. Trivial aortic atherosclerosis.   ASSESSMENT AND PLAN:  1.  ***   Current medicines are reviewed at length with the patient today.  I have spent *** dedicated to the care of this patient on the date of this encounter to include pre-visit review of records, assessment, management and diagnostic testing,with shared decision making.  Labs/ tests ordered today include: *** Phill Myron. West Pugh, ANP, AACC   08/06/2020 5:41 PM    [facility] [place] [address] Office [phone] Fax [phone]  Notice: This dictation was prepared with Dragon dictation along with smaller phrase technology. Any transcriptional errors that result from  this process are unintentional and may not be corrected upon review.

## 2020-08-08 ENCOUNTER — Ambulatory Visit: Payer: No Typology Code available for payment source | Admitting: Adult Health

## 2020-08-11 ENCOUNTER — Other Ambulatory Visit: Payer: Self-pay | Admitting: Family Medicine

## 2020-08-14 MED ORDER — MEGESTROL ACETATE 40 MG PO TABS: 40.0000 mg | ORAL_TABLET | Freq: Two times a day (BID) | ORAL | 1 refills | Status: AC

## 2021-04-27 ENCOUNTER — Other Ambulatory Visit: Payer: Self-pay

## 2021-04-27 ENCOUNTER — Encounter: Payer: Self-pay | Admitting: General Practice

## 2021-04-27 ENCOUNTER — Ambulatory Visit: Payer: No Typology Code available for payment source | Admitting: Obstetrics & Gynecology

## 2021-04-27 ENCOUNTER — Other Ambulatory Visit (HOSPITAL_COMMUNITY)
Admission: RE | Admit: 2021-04-27 | Discharge: 2021-04-27 | Disposition: A | Discharge status: Home / Self Care | Payer: No Typology Code available for payment source | Source: Ambulatory Visit | Attending: Obstetrics & Gynecology | Admitting: Obstetrics & Gynecology

## 2021-04-27 ENCOUNTER — Encounter: Payer: Self-pay | Admitting: Obstetrics & Gynecology

## 2021-04-27 VITALS — BP 120/76 | HR 76 | Wt 199.0 lb

## 2021-04-27 DIAGNOSIS — N898 Other specified noninflammatory disorders of vagina: Secondary | ICD-10-CM | POA: Insufficient documentation

## 2021-04-27 DIAGNOSIS — N924 Excessive bleeding in the premenopausal period: Secondary | ICD-10-CM | POA: Diagnosis present

## 2021-04-27 DIAGNOSIS — N946 Dysmenorrhea, unspecified: Secondary | ICD-10-CM

## 2021-04-27 DIAGNOSIS — Z30430 Encounter for insertion of intrauterine contraceptive device: Secondary | ICD-10-CM

## 2021-04-27 DIAGNOSIS — Z01419 Encounter for gynecological examination (general) (routine) without abnormal findings: Secondary | ICD-10-CM

## 2021-04-27 DIAGNOSIS — D251 Intramural leiomyoma of uterus: Secondary | ICD-10-CM

## 2021-04-27 DIAGNOSIS — N939 Abnormal uterine and vaginal bleeding, unspecified: Secondary | ICD-10-CM

## 2021-04-27 MED ORDER — TRAMADOL HCL 50 MG PO TABS: 50.0000 mg | ORAL_TABLET | Freq: Four times a day (QID) | ORAL | 0 refills | Status: DC | PRN

## 2021-04-27 MED ORDER — LEVONORGESTREL 20.1 MCG/DAY IU IUD
1.0000 | INTRAUTERINE_SYSTEM | Freq: Once | INTRAUTERINE | Status: AC
Start: 2021-04-27 — End: 2021-04-27
  Administered 2021-04-27: 1 via INTRAUTERINE

## 2021-04-27 NOTE — Patient Instructions (Signed)
Intrauterine Device Information An intrauterine device (IUD) is a medical device that is inserted into the uterus to prevent pregnancy. It is a small, T-shaped device that has one or two nylon strings hanging down from it. The strings hang out of the lower part of the uterus (cervix) to allow for future IUD removal. There are two types of IUDs: Hormone IUD. This type of IUD is made of plastic and contains the hormone progestin (synthetic progesterone). A hormone IUD may last 3-5 years. Copper IUD. This type of IUD has copper wire wrapped around it. A copper IUD may last up to 10 years. How is an IUD inserted? An IUD is inserted through the vagina, through the cervix, and into the uterus with a minor medical procedure. The procedure for IUD insertion may vary among health care providers and hospitals. How does an IUD work? Synthetic progesterone in a hormonal IUD prevents pregnancy by: Thickening cervical mucus to prevent sperm from entering the uterus. Thinning the uterine lining to prevent a fertilized egg from being implanted there. Copper in a copper IUD prevents pregnancy by making the uterus and fallopian tubes produce a fluid that kills sperm. What are the advantages of an IUD? Advantages of either type of IUD An IUD: Is highly effective in preventing pregnancy. Is reversible. You can become pregnant shortly after the IUD is removed. Is low-maintenance and can stay in place for a long time. Has no estrogen-related side effects. Can be used when breastfeeding. Is not associated with weight gain. Can be inserted right after childbirth, an abortion, or a miscarriage. Advantages of a hormone IUD If it is inserted within 7 days of your period starting, it works right after it has been inserted. If the hormone IUD is inserted at any other time in your cycle, you will need to use a backup method of birth control for 7 days after insertion. It can make menstrual periods lighter or stop  completely. It can reduce menstrual cramping and other discomforts from menstrual periods. It can be used for 3-5 years, depending on which IUD you have. Advantages of a copper IUD It works right after it is inserted. It can be used as a form of emergency birth control if it is inserted within 5 days after having unprotected sex. It does not interfere with your body's natural hormones. It can be used for up to 10 years. What are the disadvantages of an IUD? An IUD may cause irregular menstrual bleeding for a period of time after insertion. It is common to have pain during insertion and have cramping and vaginal bleeding after insertion. An IUD may cut the uterus (uterine perforation) when it is inserted. This is rare. Pelvic inflammatory disease (PID) may happen after insertion of an IUD. PID is an infection in the uterus and fallopian tubes. The IUD does not cause the infection. The infection is usually from an unknown sexually transmitted infection (STI). This is rare, and it usually happens during the first 20 days after the IUD is inserted. A copper IUD can make your menstrual flow heavier and more painful. IUDs cannot prevent sexually transmitted infections (STIs). How is an IUD removed?  You will lie on your back with your knees bent and your feet in footrests (stirrups). A device will be inserted into your vagina to spread apart the vaginal walls (speculum). This will allow your health care provider to see the strings attached to the IUD. Your health care provider will use a small instrument (forceps) to   grasp the IUD strings and will pull firmly until the IUD is removed. You may have some discomfort when the IUD is removed. Your health care provider may recommend taking over-the-counter pain relievers, such as ibuprofen, before the procedure. You may also have minor spotting for a few days after the procedure. The procedure for IUD removal may vary among health care providers and  hospitals. Is an IUD right for me? If you are interested in an IUD, discuss it with your health care provider. He or she will make sure you are a good candidate for an IUD and will let you know more about the advantages, disadvantage, and possible side effects. This will allow you to make a decision about the device. Summary An intrauterine device (IUD) is a medical device that is inserted in the uterus to prevent pregnancy. It is a small, T-shaped device that has one or two nylon strings hanging down from it. A hormone IUD contains the hormone progestin (synthetic progesterone). A copper IUD has copper wire wrapped around it. Synthetic progesterone in a hormone IUD prevents pregnancy by thickening cervical mucus and thinning the walls of the uterus. Copper in a copper IUD prevents pregnancy by making the uterus and fallopian tubes produce a fluid that kills sperm. A hormone IUD can be left in place for 3-5 years. A copper IUD can be left in place for up to 10 years. An IUD is inserted and removed by a health care provider. You may feel some pain during insertion and removal. Your health care provider may recommend taking over-the-counter pain medicine, such as ibuprofen, before an IUD procedure. This information is not intended to replace advice given to you by your health care provider. Make sure you discuss any questions you have with your health care provider. Document Revised: 12/19/2019 Document Reviewed: 12/19/2019 Elsevier Patient Education  2022 [place]. Intrauterine Device Insertion, Care After This sheet gives you information about how to care for yourself after your procedure. Your health care provider may also give you more specific instructions. If you have problems or questions, contact your health care provider. What can I expect after the procedure? After the procedure, it is common to have: Cramps and pain in the abdomen. Bleeding. It may be light or heavy. This may last for  a few days. Lower back pain. Dizziness. Headaches. Nausea. Follow these instructions at home:  Before resuming sexual activity, check to make sure that you can feel the IUD string or strings. You should be able to feel the end of the string below the opening of your cervix. If your IUD string is in place, you may resume sexual activity. If you had a hormonal IUD inserted more than 7 days after your most recent period started, you will need to use a backup method of birth control for 7 days after IUD insertion. Ask your health care provider whether this applies to you. Continue to check that the IUD is still in place by feeling for the strings after every menstrual period, or once a month. An IUD will not protect you from sexually transmitted infections (STIs). Use methods to prevent the exchange of body fluids between partners (barrier protection) every time you have sex. Barrier protection can be used during oral, vaginal, or anal sex. Commonly used barrier methods include: Female condom. Female condom. Dental dam. Take over-the-counter and prescription medicines only as told by your health care provider. Keep all follow-up visits as told by your health care provider. This is important.  Contact a health care provider if: You feel light-headed or weak. You have any of the following problems with your IUD string or strings: The string bothers or hurts you or your sexual partner. You cannot feel the string. The string has gotten longer. You can feel the IUD in your vagina. You think you may be pregnant, or you miss your menstrual period. You think you may have a sexually transmitted infection (STI). Get help right away if: You have flu-like symptoms, such as tiredness (fatigue) and muscle aches. You have a fever and chills. You have bleeding that is heavier or lasts longer than a normal menstrual cycle. You have abnormal or bad-smelling discharge from your vagina. You develop abdominal pain  that is new, is getting worse, or is not in the same area of earlier cramping and pain. You have pain during sexual activity. Summary After the procedure, it is common to have cramps and pain in the abdomen. It is also common to have light bleeding or heavier bleeding that is like your menstrual period. Continue to check that the IUD is still in place by feeling for the strings after every menstrual period, or once a month. Keep all follow-up visits as told by your health care provider. This is important. Contact your health care provider if you have problems with your IUD strings, such as the string getting longer or bothering you or your sexual partner. This information is not intended to replace advice given to you by your health care provider. Make sure you discuss any questions you have with your health care provider. Document Revised: 05/29/2019 Document Reviewed: 05/29/2019 Elsevier Patient Education  [place]. Endometrial Biopsy An endometrial biopsy is a procedure to remove tissue samples from the endometrium, which is the lining of the uterus. The tissue that is removed can then be checked under a microscope for disease. This procedure is used to diagnose conditions such as endometrial cancer, endometrial tuberculosis, polyps, or other inflammatory conditions. This procedure may also be used to investigate uterine bleeding to determine where you are in your menstrual cycle or how your hormone levels are affecting the lining of the uterus. Tell a health care provider about: Any allergies you have. All medicines you are taking, including vitamins, herbs, eye drops, creams, and over-the-counter medicines. Any problems you or family members have had with anesthetic medicines. Any blood disorders you have. Any surgeries you have had. Any medical conditions you have. Whether you are pregnant or may be pregnant. What are the risks? Generally, this is a safe procedure. However,  problems may occur, including: Bleeding. Pelvic infection. Puncture of the wall of the uterus with the biopsy device (rare). Allergic reactions to medicines. What happens before the procedure? Keep a record of your menstrual cycles as told by your health care provider. You may need to schedule your procedure for a specific time in your cycle. You may want to bring a sanitary pad to wear after the procedure. Plan to have someone take you home from the hospital or clinic. Ask your health care provider about: Changing or stopping your regular medicines. This is especially important if you are taking diabetes medicines, arthritis medicines, or blood thinners. Taking medicines such as aspirin and ibuprofen. These medicines can thin your blood. Do not take these medicines unless your health care provider tells you to take them. Taking over-the-counter medicines, vitamins, herbs, and supplements. What happens during the procedure? You will lie on an exam table with your feet and legs  supported as in a pelvic exam. Your health care provider will insert an instrument (speculum) into your vagina to see your cervix. Your cervix will be cleansed with an antiseptic solution. A medicine (local anesthetic) will be used to numb the cervix. A forceps instrument (tenaculum) will be used to hold your cervix steady for the biopsy. A thin, rod-like instrument (uterine sound) will be inserted through your cervix to determine the length of your uterus and the location where the biopsy sample will be removed. A thin, flexible tube (catheter) will be inserted through your cervix and into the uterus. The catheter will be used to collect the biopsy sample from your endometrial tissue. The catheter and speculum will then be removed, and the tissue sample will be sent to a lab for examination. The procedure may vary among health care providers and hospitals. What can I expect after procedure? You will rest in a recovery  area until you are ready to go home. You may have mild cramping and a small amount of vaginal bleeding. This is normal. You may have a small amount of vaginal bleeding for a few days. This is normal. It is up to you to get the results of your procedure. Ask your health care provider, or the department that is doing the procedure, when your results will be ready. Follow these instructions at home: Take over-the-counter and prescription medicines only as told by your health care provider. Do not douche, use tampons, or have sexual intercourse until your health care provider approves. Return to your normal activities as told by your health care provider. Ask your health care provider what activities are safe for you. Follow instructions from your health care provider about any activity restrictions, such as restrictions on strenuous exercise or heavy lifting. Keep all follow-up visits. This is important. Contact a health care provider: You have heavy bleeding, or bleed for longer than 2 days after the procedure. You have bad smelling discharge from your vagina. You have a fever or chills. You have a burning sensation when urinating or you have difficulty urinating. You have severe pain in your lower abdomen. Get help right away if you: You have severe cramps in your stomach or back. You pass large blood clots. Your bleeding increases. You become weak or light-headed, or you faint or lose consciousness. Summary An endometrial biopsy is a procedure to remove tissue samples is taken from the endometrium, which is the lining of the uterus. The tissue sample that is removed will be checked under a microscope for disease. This procedure is used to diagnose conditions such as endometrial cancer, endometrial tuberculosis, polyps, or other inflammatory conditions. After the procedure, it is common to have mild cramping and a small amount of vaginal bleeding for a few days. Do not douche, use tampons,  or have sexual intercourse until your health care provider approves. Ask your health care provider which activities are safe for you. This information is not intended to replace advice given to you by your health care provider. Make sure you discuss any questions you have with your health care provider. Document Revised: 02/18/2021 Document Reviewed: 12/31/2019 Elsevier Patient Education  2022 [place] POST-PROCEDURE INSTRUCTIONS  You may take Ibuprofen, Aleve or Tylenol for pain if needed.  Cramping should resolve within in 24 hours.  You may have a small amount of spotting.  You should wear a mini pad for the next few days.  You may have intercourse after 24 hours.  You need to  call if you have any pelvic pain, fever, heavy bleeding or foul smelling vaginal discharge.  Shower or bathe as normal  6. We will call you within one week with results or we will discuss   the results at your follow-up appointment if needed.

## 2021-04-27 NOTE — Progress Notes (Signed)
Subjective:     Tracy Decker is a 55 y.o. female here for a consult for evaluation of menorrhagia. Pt is scheduled for a hysterectomy consult but, pt reports that she is hesitant about surgery and would like to explore other others. She reports that she is open to whatever we need to do to get her sx resolved. She reports daily bleeding that affects her quality of life. She also reports that she smells like 'raw meat' at all times. She reports that the bleeding occurs on a daily bleeding is varying intensities. Sometimes it is very light and at other times it is heavy. She reports that she sometimes has cramping with the bleedings.   Her mother, who is with her today, stopped her menses at age 61 years. She is often confused as being her mothers sister!   Gynecologic History No LMP recorded (lmp unknown). Last Pap: no record.  Last mammogram: 10/27/2020. Results were: normal  Obstetric History OB History  Gravida Para Term Preterm AB Living  1 1 1     1   SAB IAB Ectopic Multiple Live Births          1    # Outcome Date GA Lbr Len/2nd Weight Sex Delivery Anes PTL Lv  1 Term 09/18/86    F  EPI  LIV    The following portions of the patient's history were reviewed and updated as appropriate: allergies, current medications, past family history, past medical history, past social history, past surgical history, and problem list.  Review of Systems Pertinent items are noted in HPI.    Objective:  BP 120/76   Pulse 76   Wt 199 lb (90.3 kg)   LMP  (LMP Unknown)   BMI 34.16 kg/m   CONSTITUTIONAL: Well-developed, well-nourished female in no acute distress.  HENT:  Normocephalic, atraumatic EYES: Conjunctivae and EOM are normal. No scleral icterus.  NECK: Normal range of motion SKIN: Skin is warm and dry. No rash noted. Not diaphoretic.No pallor. [place]: Alert and oriented to person, place, and time. Normal coordination.  Abd; soft, NT, ND GU: EGBUS: no lesions Vagina: min blood in  vault Cervix: there is a large Nabothian cyst. No mucopurulent d/c Uterus: 9-10 weeks sized; mobile Adnexa: no masses; nontender tender    GYNECOLOGY CLINIC PROCEDURE NOTE  The indications for endometrial biopsy were reviewed.   Risks of the biopsy including cramping, bleeding, infection, uterine perforation, inadequate specimen and need for additional procedures  were discussed. The patient states she understands and agrees to undergo procedure today. Consent was signed. Time out was performed. Urine HCG could not be completed. A sterile speculum was placed in the patient's vagina and the cervix was prepped with Betadine. A single-toothed tenaculum was placed on the anterior lip of the cervix to stabilize it. The 3 mm pipelle was introduced into the endometrial cavity without difficulty to a depth of 9cm, and a moderate amount of tissue was obtained and sent to pathology.   At this point, a second time out was done. Patient identified, informed consent performed.  Discussed risks of irregular bleeding, cramping, infection, malpositioning or misplacement of the IUD outside the uterus which may require further procedures. Time out was performed.   Grasped anteriorly with a single tooth tenaculum.  Uterus sounded to 9 cm.  Liletta IUD placed per manufacturer's recommendations.  Strings trimmed to 3 cm. Tenaculum was removed, good hemostasis noted.  Patient tolerated procedure well.   04/24/2020 CLINICAL DATA:  Initial evaluation for abnormal  uterine bleeding.   EXAM: ULTRASOUND PELVIS TRANSVAGINAL   TECHNIQUE: Transvaginal ultrasound examination of the pelvis was performed including evaluation of the uterus, ovaries, adnexal regions, and pelvic cul-de-sac.   COMPARISON:  None available.   FINDINGS: Uterus   Measurements: 8.5 x 5.5 x 6.8 cm = volume: 166.4 mL. Uterus is retroflexed. Multiple scattered fibroids are seen as follows:   1.3 x 1.4 x 1.3 cm exophytic fibroid extends from the  right anterior uterine fundus.   2.8 x 3.2 x 3.4 cm intramural fibroid present at the right posterior mid uterine body.   4.0 x 2.5 x 3.4 cm intramural fibroid present at the left uterine fundus.   2.0 x 1.8 x 2.0 cm intramural fibroid also seen at the left uterine fundus/body.   Several complex cystic lesion seen clustered about the cervix, largest of which measures 1.4 x 0.9 x 1.3 cm, favored to reflect mildly complex nabothian cyst.   Endometrium   Thickness: 9.3 mm.  No focal abnormality visualized.   Right ovary   Measurements: 2.8 x 1.6 x 1.6 cm = volume: 3.6 mL. Normal appearance/no adnexal mass.   Left ovary   Measurements: 2.6 x 2.8 x 2.7 cm = volume: 10.4 mL. Normal appearance/no adnexal mass.   Other findings:  No abnormal free fluid   IMPRESSION: 1. Fibroid uterus as detailed above, largest of which measures 4 cm in size at the left uterine fundus. 2. Endometrial stripe measures 9.3 mm in thickness. If bleeding remains unresponsive to hormonal or medical therapy, sonohysterogram should be considered for focal lesion work-up. (Ref: Radiological Reasoning: Algorithmic Workup of Abnormal Vaginal Bleeding with Endovaginal Sonography and Sonohysterography. AJR 2008; 245:Y09-98). 3. Normal sonographic appearance of the ovaries. No adnexal mass or free fluid.   AUB/perimenopausal bleeding. I discussed with pt all of her sx and the options for management. Pt had an endo bx today and chose ot have a LnIUD placed.   Diagnoses and all orders for this visit:  Abnormal uterine bleeding (AUB) -     levonorgestrel (LILETTA) 20.1 MCG/DAY IUD 1 each  Fibroids, intramural  Abnormal perimenopausal bleeding -     Surgical pathology -     levonorgestrel (LILETTA) 20.1 MCG/DAY IUD 1 each  Encounter for IUD insertion  Dysmenorrhea -     traMADol (ULTRAM) 50 MG tablet; Take 1 tablet (50 mg total) by mouth every 6 (six) hours as needed for severe pain.  Well woman  exam with routine gynecological exam -     Cytology - PAP( [place])  Vaginal discharge -     Cytology - PAP( [place])   F/u in 4 weeks for IUD check or sooner prn   Lyfe Reihl L. Harraway-Smith, M.D., [place]

## 2021-04-28 ENCOUNTER — Encounter: Payer: Self-pay | Admitting: Obstetrics & Gynecology

## 2021-04-29 LAB — SURGICAL PATHOLOGY

## 2021-05-03 LAB — CYTOLOGY - PAP
Comment: NEGATIVE
Diagnosis: NEGATIVE
Diagnosis: REACTIVE
High risk HPV: NEGATIVE

## 2021-05-13 ENCOUNTER — Other Ambulatory Visit: Payer: Self-pay

## 2021-05-13 ENCOUNTER — Telehealth: Payer: Self-pay

## 2021-05-13 MED ORDER — NORETHIN ACE-ETH ESTRAD-FE 1-20 MG-MCG(24) PO TABS: 1.0000 | ORAL_TABLET | Freq: Every day | ORAL | 1 refills | Status: DC

## 2021-05-13 NOTE — Telephone Encounter (Signed)
Patient called stating that she has been bleeding since her IUD was placed on 04-27-21 (has fibroid uterus; endometrial stripe 9.47mm) Patient would like to see if there is anything you can do to make the bleeding stop. Will route to provider for input. Kathrene Alu RN

## 2021-05-13 NOTE — Progress Notes (Signed)
Called patient back and she would like to try a birth control pl with her IUD due to the bleeding. Per Dr. Ihor Dow sent in [place] 1-20 to patients pharmacy for her to try for one month. Kathrene Alu RN

## 2021-06-10 ENCOUNTER — Encounter: Payer: Self-pay | Admitting: Obstetrics & Gynecology

## 2021-06-10 ENCOUNTER — Ambulatory Visit: Payer: No Typology Code available for payment source | Admitting: Family Medicine

## 2021-06-10 ENCOUNTER — Other Ambulatory Visit: Payer: Self-pay

## 2021-06-10 ENCOUNTER — Ambulatory Visit: Payer: No Typology Code available for payment source | Admitting: Obstetrics & Gynecology

## 2021-06-10 VITALS — BP 137/76 | HR 69 | Wt 206.0 lb

## 2021-06-10 DIAGNOSIS — Z30431 Encounter for routine checking of intrauterine contraceptive device: Secondary | ICD-10-CM

## 2021-06-10 NOTE — Progress Notes (Signed)
°  GYNECOLOGY OFFICE ENCOUNTER NOTE  History:  55 y.o. G1P1001 here today for today for IUD string check; Liletta  IUD was placed  04/27/2021. No complaints about the IUD, no concerning side effects. Pt reports that she has not seen a difference with the bleeding or pain since the in IUD was placed. She is frustrated and feels a little depressed. She feels that this is hindering her from having a productive relationship.   The following portions of the patient's history were reviewed and updated as appropriate: allergies, current medications, past family history, past medical history, past social history, past surgical history and problem list.   Review of Systems:  Pertinent items are noted in HPI.   Objective:  Physical Exam Blood pressure 137/76, pulse 69, weight 206 lb (93.4 kg). CONSTITUTIONAL: Well-developed, well-nourished female in no acute distress.  HENT:  Normocephalic, atraumatic. External right and left ear normal. Oropharynx is clear and moist EYES: Conjunctivae and EOM are normal. Pupils are equal, round, and reactive to light. No scleral icterus.  NECK: Normal range of motion, supple, no masses CARDIOVASCULAR: Normal heart rate noted RESPIRATORY: Effort and breath sounds normal, no problems with respiration noted ABDOMEN: Soft, no distention noted.   PELVIC: Normal appearing external genitalia; normal appearing vaginal mucosa and cervix.  IUD strings visualized, about 3 cm in length outside cervix.   Assessment & Plan:  Pt will stop OCPs.  F/u in 2 months.  If sx still the same, can consider definite management. Pt will need cardiac clearance if surgery is considered. 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  If sx improved patient may keep IUD in place for up to seven years.  Alexia Dinger L. Harraway-Smith, MD, [place], [facility] for [employer], [place]

## 2021-06-17 ENCOUNTER — Ambulatory Visit: Payer: No Typology Code available for payment source | Admitting: Obstetrics & Gynecology

## 2021-07-05 ENCOUNTER — Encounter: Payer: Self-pay | Admitting: Obstetrics & Gynecology

## 2021-07-07 ENCOUNTER — Other Ambulatory Visit: Payer: Self-pay | Admitting: Obstetrics & Gynecology

## 2021-07-07 DIAGNOSIS — N898 Other specified noninflammatory disorders of vagina: Secondary | ICD-10-CM

## 2021-07-07 MED ORDER — METRONIDAZOLE 500 MG PO TABS: 500.0000 mg | ORAL_TABLET | Freq: Two times a day (BID) | ORAL | 0 refills | Status: DC

## 2021-07-31 ENCOUNTER — Telehealth (HOSPITAL_BASED_OUTPATIENT_CLINIC_OR_DEPARTMENT_OTHER): Payer: Self-pay | Admitting: Cardiology

## 2021-07-31 NOTE — Telephone Encounter (Signed)
° °  Pre-operative Risk Assessment    Patient Name: Tracy Decker  DOB: 1965/09/28 MRN: [id number]      Request for Surgical Clearance    Procedure:   Right Ring Finger Mass Excision  Date of Surgery:  Clearance TBD                                 Surgeon:  Dr. Skipper Cliche Group or Practice Name:  [facility] Phone number:  [phone] Fax number:  [phone]   Type of Clearance Requested:   - Medical    Type of Anesthesia:   Local MAC/IV Reg   Additional requests/questions:   None  Barbaraann Faster   07/31/2021, 11:46 AM

## 2021-07-31 NOTE — Telephone Encounter (Signed)
Left a message for the patient to call back and speak to the on-call preop APP of the day.  It has been close to 1 year since the last visit, previous coronary CT obtained in January 2022 showed minimal coronary plaque.

## 2021-08-02 NOTE — Telephone Encounter (Signed)
° °  Name: Tracy Decker  DOB: 03/31/1966  MRN: [id number]   Primary Cardiologist: Buford Dresser, MD  Chart reviewed as part of pre-operative protocol coverage. Patient was contacted 08/02/2021 in reference to pre-operative risk assessment for pending surgery as outlined below.  Ann-Marie Kluge was last seen on 08/04/2020 by by Dr. Harrell Gave.  Since that day, Myrle Wanek has done well without exertional chest pain or worsening dyspnea.  Previous coronary CT obtained in January 2022 showed minimal coronary plaque.  Therefore, based on ACC/AHA guidelines, the patient would be at acceptable risk for the planned procedure without further cardiovascular testing.   The patient was advised that if she develops new symptoms prior to surgery to contact our office to arrange for a follow-up visit, and she verbalized understanding.  I will route this recommendation to the requesting party via Epic fax function and remove from pre-op pool. Please call with questions.  [place], [place] 08/02/2021, 5:51 PM

## 2021-08-12 ENCOUNTER — Ambulatory Visit: Payer: No Typology Code available for payment source | Admitting: Obstetrics & Gynecology

## 2021-09-09 ENCOUNTER — Encounter: Payer: Self-pay | Admitting: Obstetrics & Gynecology

## 2021-09-18 ENCOUNTER — Other Ambulatory Visit: Payer: Self-pay

## 2021-09-18 DIAGNOSIS — N898 Other specified noninflammatory disorders of vagina: Secondary | ICD-10-CM

## 2021-09-18 MED ORDER — METRONIDAZOLE 500 MG PO TABS: 500.0000 mg | ORAL_TABLET | Freq: Two times a day (BID) | ORAL | 0 refills | Status: DC

## 2021-09-18 NOTE — Progress Notes (Signed)
Error

## 2021-09-18 NOTE — Progress Notes (Signed)
Patient called and made aware that Dr. Ihor Dow would like for her to try a round of flagyl and then after completing it she should do boric acid suppositories three times a week. Kathrene Alu RN  ?

## 2021-10-08 ENCOUNTER — Ambulatory Visit (HOSPITAL_BASED_OUTPATIENT_CLINIC_OR_DEPARTMENT_OTHER): Payer: No Typology Code available for payment source | Admitting: Cardiology

## 2021-10-16 IMAGING — CT CT HEART MORP W/ CTA COR W/ SCORE W/ CA W/CM &/OR W/O CM
4 of 7 series · 8 of 20 positions shown, 9 images · IV contrast (APPLIED)
Comparison: 07/15/2020 CTA chest.
COMPARISON: 07/15/2020 CTA chest.

Addendum:
EXAM:
OVER-READ INTERPRETATION  CT CHEST

The following report is an over-read performed by radiologist Dr.
Morgado Marku [REDACTED] on 07/16/2020. This over-read
does not include interpretation of cardiac or coronary anatomy or
pathology. The coronary CTA interpretation by the cardiologist is
attached.
HISTORY: Chest pain/anginal equiv, ECGs or troponins abnormal
Cardiac/Coronary CT
TECHNIQUE: The patient was scanned on a Siemens Force scanner.
PROTOCOL: A 120 kV prospective scan was triggered in the descending thoracic
aorta at 111 HU's. Axial non-contrast 3 mm slices were carried out
through the heart. The data set was analyzed on a dedicated work
station and scored using the Agatson method. Gantry rotation speed
was 250 msecs and collimation was 0.6 mm. Beta blockade and 0.8 mg
of sl NTG was given. The 3D data set was reconstructed in 5%
intervals of 35-75% of the R-R cycle. Diastolic phases were analyzed
on a dedicated work station using MPR, MIP and VRT modes. The
patient received 80mL OMNIPAQUE IOHEXOL 350 MG/ML SOLN of contrast.

[Series 6: best diast · axial · 0.39mm/px · z∈[-305,-272]mm · 2 of 253 slices shown, 3 images]
[im 85/253  vessel]
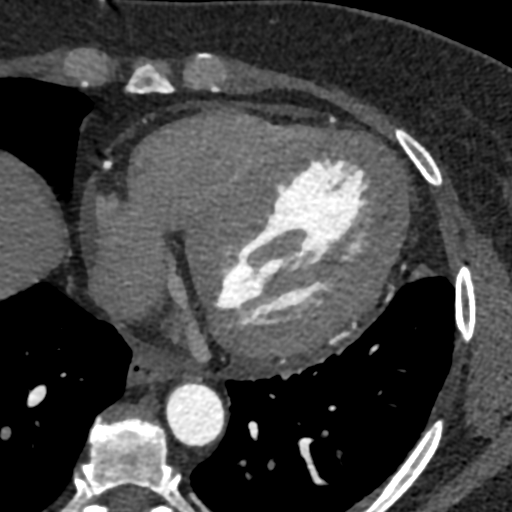
[im 85/253  lung]
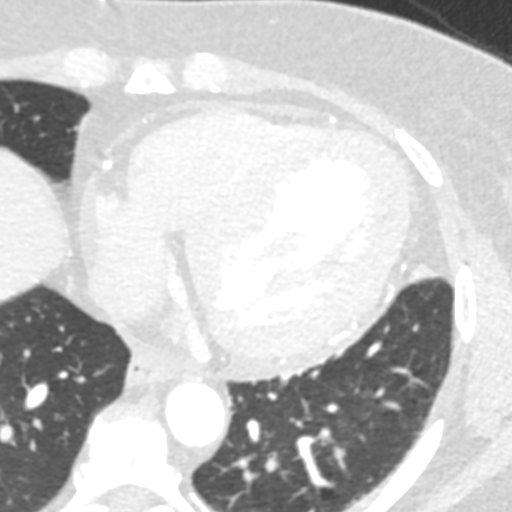
[im 169/253  vessel]
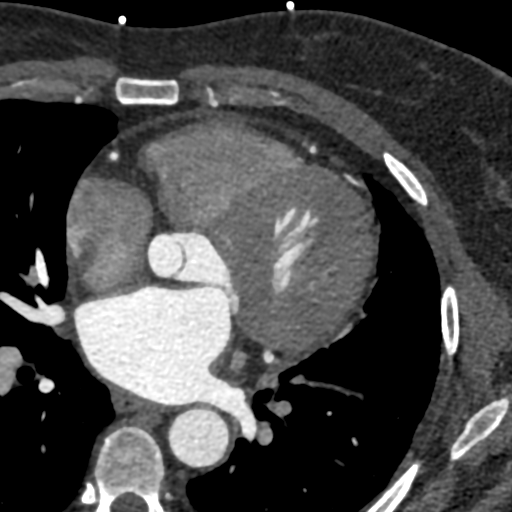

[Series 7: best syst · axial · 0.39mm/px · z∈[-305,-272]mm · 2 of 253 slices shown]
[im 85/253  vessel]
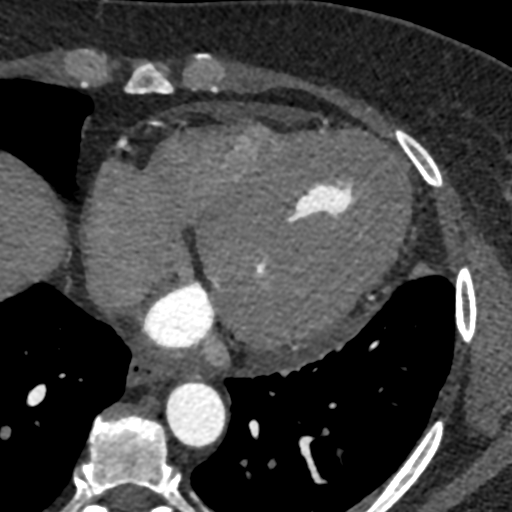
[im 169/253  vessel]
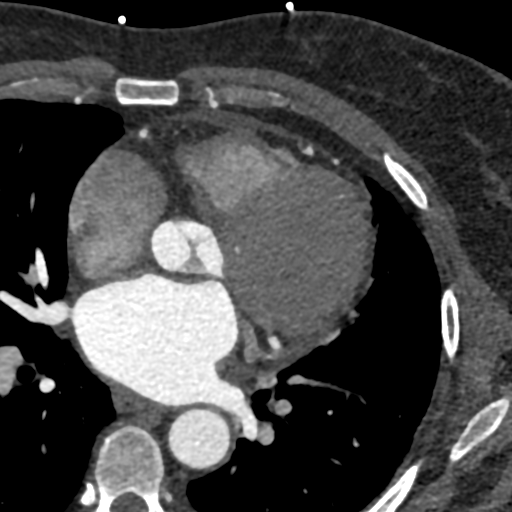

[Series 8: ts diast sharp 77 % · axial · 0.39mm/px · z∈[-305,-272]mm · 2 of 253 slices shown]
[im 85/253  lung]
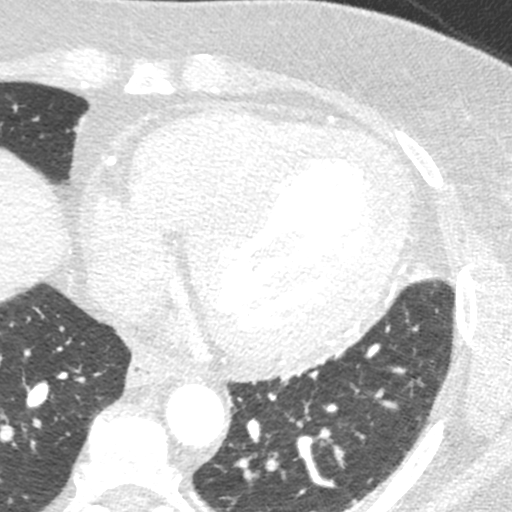
[im 169/253  lung]
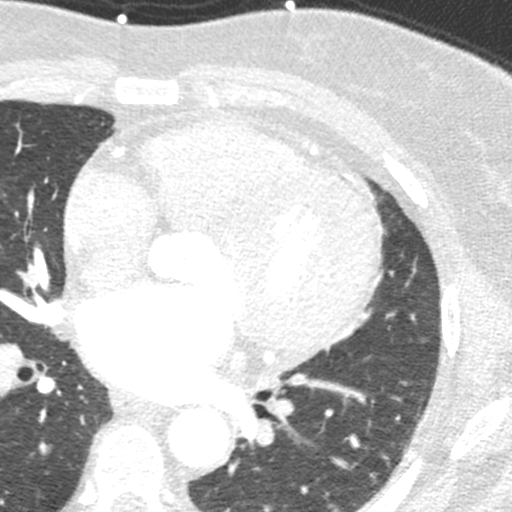

[Series 9: ts syst sharp · axial · 0.39mm/px · z∈[-305,-272]mm · 2 of 253 slices shown]
[im 85/253  lung]
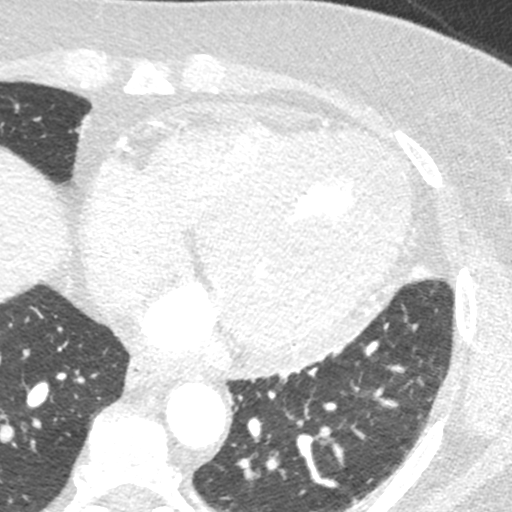
[im 169/253  lung]
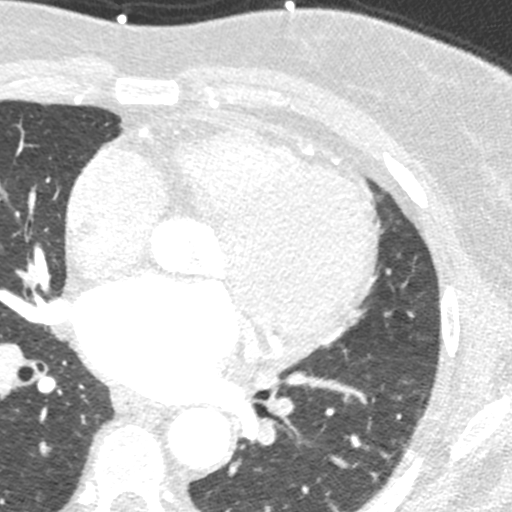

[8 of 20 positions shown; findings below may reference images not displayed]

FINDINGS: Vascular: Aortic atherosclerosis. No central pulmonary embolism, on
this non-dedicated study.

Mediastinum/Nodes: No imaged thoracic adenopathy.

Lungs/Pleura: Trace left pleural thickening. The right lower lobe
pleural-based opacity is only partially imaged and was detailed on
yesterday's exam.

Upper Abdomen: Normal imaged portions of the liver, spleen.

Musculoskeletal: No acute osseous abnormality.
IMPRESSION: 1.  No acute findings in the imaged extracardiac chest.
2.  Aortic Atherosclerosis (ROBZJ-C4F.F).
FINDINGS: Coronary calcium score: The patient's coronary artery calcium score
is 3, which places the patient in the 83rd percentile.

Coronary arteries: Normal coronary origins.  Right dominance.

Right Coronary Artery: Normal caliber vessel, gives rise to PDA. No
significant plaque or stenosis.

Left Main Coronary Artery: Normal caliber vessel. No significant
plaque or stenosis.

Left Anterior Descending Coronary Artery: Normal caliber vessel.
Very small area of noncalcified plaque at the takeoff of D1, 1-24%
stenosis. Gives rise to 2 diagonal branches without significant
plaque or stenosis. Distal LAD wraps apex.

Left Circumflex Artery: Normal caliber vessel. No significant plaque
or stenosis. Gives rise to 2 large OM branches without significant
plaque or stenosis.

Aorta: Normal size, mm at the mid ascending aorta (level of the PA
bifurcation) measured double oblique. Trivial calcifications,
consistent with aortic atherosclerosis. No dissection.

Aortic Valve: No calcifications. Trileaflet.

Other findings:

Normal pulmonary vein drainage into the left atrium.

Normal left atrial appendage without a thrombus.

Normal size of the pulmonary artery.

Likely small PFO, incidental finding.
IMPRESSION: 1.  Minimal nonobstructive CAD, CADRADS = 1.

2. Coronary calcium score of 3. This was 83rd percentile for age and
sex matched control.

3. Normal coronary origin with right dominance.

4.  Incidental very small PFO.

5.  Trivial aortic atherosclerosis.

*** End of Addendum ***
EXAM:
OVER-READ INTERPRETATION  CT CHEST

The following report is an over-read performed by radiologist Dr.
Morgado Marku [REDACTED] on 07/16/2020. This over-read
does not include interpretation of cardiac or coronary anatomy or
pathology. The coronary CTA interpretation by the cardiologist is
attached.
FINDINGS: Vascular: Aortic atherosclerosis. No central pulmonary embolism, on
this non-dedicated study.

Mediastinum/Nodes: No imaged thoracic adenopathy.

Lungs/Pleura: Trace left pleural thickening. The right lower lobe
pleural-based opacity is only partially imaged and was detailed on
yesterday's exam.

Upper Abdomen: Normal imaged portions of the liver, spleen.

Musculoskeletal: No acute osseous abnormality.
IMPRESSION: 1.  No acute findings in the imaged extracardiac chest.
2.  Aortic Atherosclerosis (ROBZJ-[id number].F).

## 2021-11-03 ENCOUNTER — Encounter (HOSPITAL_BASED_OUTPATIENT_CLINIC_OR_DEPARTMENT_OTHER): Payer: Self-pay

## 2021-11-03 ENCOUNTER — Encounter (HOSPITAL_BASED_OUTPATIENT_CLINIC_OR_DEPARTMENT_OTHER): Payer: Self-pay | Admitting: Cardiology

## 2021-11-03 ENCOUNTER — Ambulatory Visit (HOSPITAL_BASED_OUTPATIENT_CLINIC_OR_DEPARTMENT_OTHER): Payer: No Typology Code available for payment source | Admitting: Cardiology

## 2021-11-03 VITALS — BP 122/86 | HR 77 | Ht 64.0 in | Wt 183.0 lb

## 2021-11-03 DIAGNOSIS — I10 Essential (primary) hypertension: Secondary | ICD-10-CM

## 2021-11-03 DIAGNOSIS — K061 Gingival enlargement: Secondary | ICD-10-CM

## 2021-11-03 DIAGNOSIS — Z7189 Other specified counseling: Secondary | ICD-10-CM

## 2021-11-03 DIAGNOSIS — I2510 Atherosclerotic heart disease of native coronary artery without angina pectoris: Secondary | ICD-10-CM

## 2021-11-03 MED ORDER — VALSARTAN 320 MG PO TABS: 320.0000 mg | ORAL_TABLET | Freq: Every day | ORAL | 3 refills | Status: AC

## 2021-11-03 NOTE — Progress Notes (Signed)
?Cardiology Office Note:   ? ?Date:  11/03/2021  ? ?ID:  Tracy Decker, DOB 08-26-1965, MRN [id number] ? ?PCP:  Rudene Anda, MD  ?Cardiologist:  Buford Dresser, MD ? ?Referring MD: [name], NP  ? ?CC: follow up ? ?History of Present Illness:   ? ?Tracy Decker is a 56 y.o. female with a hx of hypertension, prior adrenal issues, obesity, personal history of Covid who is seen for follow up. I met her in the hospital 06/2020. ? ?Hospitalization from 06/2020 reviewed. Noted to have mild, largely flat elevation in hsTnI (104, 122, 153). CT cardiac with Ca score of 3 and minimal nonobstructive CAD. Echo hyperdynamic, LVH noted. CRP/ESR normal for age. ? ?Today: ?About two months ago, had piercing pain in her chest for about two weeks, then spontaneously resolved. No other chest pain. ? ?Has vertigo, staying hydrated helps her symptoms. ? ?Dentist noted gum overgrowth in one spot, wondering if it might be amlodipine. Asked if another medication might be an option. Discussed options today. Nephrologist recently decreased minoxidil to 2.5 mg daily from 5 mg daily. ? ?Denies shortness of breath at rest or with normal exertion. No PND, orthopnea, LE edema or unexpected weight gain. No syncope or palpitations.  +hot flashes/night sweats ? ?Past Medical History:  ?Diagnosis Date  ? Hypertension   ? Renal disorder   ? ? ?Past Surgical History:  ?Procedure Laterality Date  ? ADRENAL GLAND SURGERY Bilateral   ? ? ?Current Medications: ?Current Outpatient Medications on File Prior to Visit  ?Medication Sig  ? amLODipine (NORVASC) 10 MG tablet Take 10 mg by mouth daily.  ? aspirin EC 81 MG EC tablet Take 1 tablet (81 mg total) by mouth daily. Swallow whole.  ? buPROPion (WELLBUTRIN) 100 MG tablet Take by mouth.  ? Cholecalciferol (VITAMIN D3) 50 MCG (2000 UT) CAPS Take 2,000 Units by mouth daily.  ? escitalopram (LEXAPRO) 20 MG tablet Take by mouth.  ? Fluocinolone Acetonide Scalp 0.01 % OIL Apply 1 application topically  daily as needed for irritation.  ? hydrochlorothiazide (MICROZIDE) 12.5 MG capsule Take 12.5 mg by mouth daily.  ? metoprolol succinate (TOPROL-XL) 50 MG 24 hr tablet Take by mouth.  ? minoxidil (LONITEN) 2.5 MG tablet Take 5 mg by mouth daily.  ? OZEMPIC, 1 MG/DOSE, 4 MG/3ML SOPN Inject 1 mg into the skin once a week.  ? pantoprazole (PROTONIX) 40 MG tablet Take 1 tablet (40 mg total) by mouth daily.  ? rosuvastatin (CRESTOR) 40 MG tablet Take 1 tablet (40 mg total) by mouth daily.  ? spironolactone (ALDACTONE) 50 MG tablet Take 50 mg by mouth daily.  ? SUMAtriptan (IMITREX) 50 MG tablet Take by mouth.  ? traMADol (ULTRAM) 50 MG tablet Take 1 tablet (50 mg total) by mouth every 6 (six) hours as needed for severe pain.  ? zolpidem (AMBIEN) 10 MG tablet Take 10 mg by mouth at bedtime as needed for sleep.  ? ?No current facility-administered medications on file prior to visit.  ?  ? ?Allergies:   Ibuprofen  ? ?Social History  ? ?Tobacco Use  ? Smoking status: Never  ? Smokeless tobacco: Never  ?Substance Use Topics  ? Alcohol use: Not Currently  ? Drug use: Never  ? ? ?Family History: ?family history includes Breast cancer in her maternal grandmother. ? ?ROS:   ?Please see the history of present illness.  Additional pertinent ROS otherwise unremarkable. ? ?EKGs/Labs/Other Studies Reviewed:   ? ?The following studies were reviewed today: ?  Echo 07/15/20 ?1. Left ventricular ejection fraction, by estimation, is 70 to 75%. The  ?left ventricle has hyperdynamic function. The left ventricle has no  ?regional wall motion abnormalities. There is severe left ventricular  ?hypertrophy. Left ventricular diastolic  ?parameters are consistent with Grade I diastolic dysfunction (impaired  ?relaxation). Elevated left atrial pressure.  ? 2. Right ventricular systolic function is normal. The right ventricular  ?size is normal.  ? 3. A small pericardial effusion is present.  ? 4. The mitral valve is normal in structure. Trivial mitral  valve  ?regurgitation. No evidence of mitral stenosis.  ? 5. The aortic valve is tricuspid. Aortic valve regurgitation is not  ?visualized. No aortic stenosis is present.  ? 6. The inferior vena cava is normal in size with greater than 50%  ?respiratory variability, suggesting right atrial pressure of 3 mmHg.  ? ?CT cardiac 07/16/20 ?FINDINGS: ?Coronary calcium score: The patient's coronary artery calcium score ?is 3, which places the patient in the 83rd percentile. ?  ?Coronary arteries: Normal coronary origins.  Right dominance. ?  ?Right Coronary Artery: Normal caliber vessel, gives rise to PDA. No ?significant plaque or stenosis. ?  ?Left Main Coronary Artery: Normal caliber vessel. No significant ?plaque or stenosis. ?  ?Left Anterior Descending Coronary Artery: Normal caliber vessel. ?Very small area of noncalcified plaque at the takeoff of D1, 1-24% ?stenosis. Gives rise to 2 diagonal branches without significant ?plaque or stenosis. Distal LAD wraps apex. ?  ?Left Circumflex Artery: Normal caliber vessel. No significant plaque ?or stenosis. Gives rise to 2 large OM branches without significant ?plaque or stenosis. ?  ?Aorta: Normal size, mm at the mid ascending aorta (level of the PA ?bifurcation) measured double oblique. Trivial calcifications, ?consistent with aortic atherosclerosis. No dissection. ?  ?Aortic Valve: No calcifications. Trileaflet. ?  ?Other findings: ?  ?Normal pulmonary vein drainage into the left atrium. ?  ?Normal left atrial appendage without a thrombus. ?  ?Normal size of the pulmonary artery. ?  ?Likely small PFO, incidental finding. ?  ?IMPRESSION: ?1.  Minimal nonobstructive CAD, CADRADS = 1. ?  ?2. Coronary calcium score of 3. This was 83rd percentile for age and ?sex matched control. ?  ?3. Normal coronary origin with right dominance. ?  ?4.  Incidental very small PFO. ?  ?5.  Trivial aortic atherosclerosis. ? ?EKG:  EKG is personally reviewed.   ?11/03/21: NSR at 77 bpm. ST-T wave  changes similar to prior ECGs and suggestive of LVH with repolarization abnormality ? ?Recent Labs: ?No results found for requested labs within last 8760 hours.  ?Recent Lipid Panel ?   ?Component Value Date/Time  ? CHOL 183 07/15/2020 0850  ? TRIG 141 07/15/2020 0850  ? HDL 35 (L) 07/15/2020 0850  ? CHOLHDL 5.2 07/15/2020 0850  ? VLDL 28 07/15/2020 0850  ? [place] 120 (H) 07/15/2020 0850  ? ? ?Physical Exam:   ? ?VS:  BP 122/86 (BP Location: Left Arm, Patient Position: Sitting, Cuff Size: Normal)   Pulse 77   Ht $R'5\' 4"'qX$  (1.626 m)   Wt 183 lb (83 kg)   BMI 31.41 kg/m?    ? ?Wt Readings from Last 3 Encounters:  ?11/03/21 183 lb (83 kg)  ?06/10/21 206 lb (93.4 kg)  ?04/27/21 199 lb (90.3 kg)  ?  ?GEN: Well nourished, well developed in no acute distress ?HEENT: Normal, moist mucous membranes ?NECK: No JVD ?CARDIAC: regular rhythm, normal S1 and S2, no rubs or gallops. No murmur. ?VASCULAR: Radial and  DP pulses 2+ bilaterally. No carotid bruits ?RESPIRATORY:  Clear to auscultation without rales, wheezing or rhonchi  ?ABDOMEN: Soft, non-tender, non-distended ?MUSCULOSKELETAL:  Ambulates independently ?SKIN: Warm and dry, no edema ?NEUROLOGIC:  Alert and oriented x 3. No focal neuro deficits noted. ?PSYCHIATRIC:  Normal affect   ? ?ASSESSMENT:   ? ?1. Essential hypertension   ?2. Nonocclusive coronary atherosclerosis of native coronary artery   ?3. Cardiac risk counseling   ?4. Counseling on health promotion and disease prevention   ?5. Gum hyperplasia   ? ? ?PLAN:   ? ?Hypertension ?LVH on echo, LVH on ECG with repolarization abnormality ?-dentist concerned with gum hyperplasia. Wants to try to come off amlodipine. Concern is that her BP is currently well controlled, which has been a challenge ?-will change lisinopril to valsartan 320 mg daily to try to improve control and given room to come off amlodipine. If systolic <597, cut amlodipine to 5 mg. If <110, stop amlodipine. ?-continue HCTZ, metoprolol,  spironolactone ?-next step would be to change metoprolol to carvedilol or HCTZ to chlorthalidone. She does not like HCTZ due to increased urination ? ?Atypical chest pain ?Nonobstructive CAD ?-calcium score of 3, trivial no

## 2021-11-03 NOTE — Telephone Encounter (Signed)
Have you ever heard of them needing a code?  ?

## 2021-11-03 NOTE — Telephone Encounter (Signed)
Pt questioned answered at appointment on 5/16 ?

## 2021-11-03 NOTE — Patient Instructions (Signed)
Medication Instructions:  ?We are changing lisinopril to valsartan 320 mg daily. Stop lisinopril. ? ?Once you start the valsartan, check blood pressure. If top number <130, cut amlodipine to 5 mg. If <110, stop amlodipine. ? ?*If you need a refill on your cardiac medications before your next appointment, please call your pharmacy* ? ? ?Lab Work: ?Your provider has recommended lab work in 2-3 weeks (BMP). Please have this collected at [facility] [place]. The lab is open 8:00 am - 4:30 pm. Please avoid 12:00p - 1:00p for lunch hour. You do not need an appointment. Please go to [address] [place], [place] [address]. This is in the Primary Care office on the 3rd floor, let them know you are there for blood work and they will direct you to the lab. ? ?If you have labs (blood work) drawn today and your tests are completely normal, you will receive your results only by: ?MyChart Message (if you have MyChart) OR ?A paper copy in the mail ?If you have any lab test that is abnormal or we need to change your treatment, we will call you to review the results. ? ? ?Testing/Procedures: ?None ordered today ? ? ?Follow-Up: ?At [facility], you and your health needs are our priority.  As part of our continuing mission to provide you with exceptional heart care, we have created designated Provider Care Teams.  These Care Teams include your primary Cardiologist (physician) and Advanced Practice Providers (APPs -  Physician Assistants and Nurse Practitioners) who all work together to provide you with the care you need, when you need it. ? ?We recommend signing up for the patient portal called "MyChart".  Sign up information is provided on this After Visit Summary.  MyChart is used to connect with patients for Virtual Visits (Telemedicine).  Patients are able to view lab/test results, encounter notes, upcoming appointments, etc.  Non-urgent messages can be sent to your provider as well.   ?To learn  more about what you can do with MyChart, go to [email or web address].   ? ?Your next appointment:   ?6 week(s) ? ?The format for your next appointment:   ?In Person ? ?Provider:   ?Buford Dresser, MD ?Or Laurann Montana, NP  ? ? ?Important Information About Sugar ? ? ? ? ? ? ?We are changing lisinopril to valsartan. Stop lisinopril. ? ?Once you start the valsartan, check blood pressure. If top number <130, cut amlodipine to 5 mg. If <110, stop amlodipine. ? ?Recheck kidney function in about 2-3 weeks.  ?

## 2021-11-18 ENCOUNTER — Ambulatory Visit (HOSPITAL_BASED_OUTPATIENT_CLINIC_OR_DEPARTMENT_OTHER): Payer: No Typology Code available for payment source | Admitting: Family

## 2021-12-06 ENCOUNTER — Encounter: Payer: Self-pay | Admitting: Obstetrics & Gynecology

## 2021-12-07 ENCOUNTER — Other Ambulatory Visit: Payer: Self-pay

## 2021-12-07 DIAGNOSIS — B9689 Other specified bacterial agents as the cause of diseases classified elsewhere: Secondary | ICD-10-CM

## 2021-12-07 MED ORDER — METRONIDAZOLE 500 MG PO TABS: 500.0000 mg | ORAL_TABLET | Freq: Two times a day (BID) | ORAL | 0 refills | Status: DC

## 2021-12-07 NOTE — Progress Notes (Signed)
Pt sent Mychart message requesting a refill of Flagyl. Pt states she had 2 periods this month and now she is having vaginal odor. Flagyl 500 mg 1 tablet BID x 7 days was sent to her pharmacy. TRUE Garciamartinez l Gualberto Wahlen, CMA

## 2021-12-15 ENCOUNTER — Ambulatory Visit (HOSPITAL_BASED_OUTPATIENT_CLINIC_OR_DEPARTMENT_OTHER): Payer: No Typology Code available for payment source | Admitting: Family

## 2021-12-30 ENCOUNTER — Other Ambulatory Visit (HOSPITAL_COMMUNITY)
Admission: RE | Admit: 2021-12-30 | Discharge: 2021-12-30 | Disposition: A | Discharge status: Home / Self Care | Payer: No Typology Code available for payment source | Source: Ambulatory Visit | Attending: Obstetrics & Gynecology | Admitting: Obstetrics & Gynecology

## 2021-12-30 ENCOUNTER — Encounter: Payer: Self-pay | Admitting: Obstetrics & Gynecology

## 2021-12-30 ENCOUNTER — Ambulatory Visit: Payer: No Typology Code available for payment source | Admitting: Obstetrics & Gynecology

## 2021-12-30 ENCOUNTER — Ambulatory Visit (HOSPITAL_BASED_OUTPATIENT_CLINIC_OR_DEPARTMENT_OTHER): Payer: No Typology Code available for payment source

## 2021-12-30 ENCOUNTER — Ambulatory Visit (HOSPITAL_BASED_OUTPATIENT_CLINIC_OR_DEPARTMENT_OTHER)
Admission: RE | Admit: 2021-12-30 | Discharge: 2021-12-30 | Disposition: A | Discharge status: Home / Self Care | Payer: No Typology Code available for payment source | Source: Ambulatory Visit | Attending: Obstetrics & Gynecology | Admitting: Obstetrics & Gynecology

## 2021-12-30 VITALS — BP 127/89 | HR 77 | Ht 64.0 in | Wt 177.0 lb

## 2021-12-30 DIAGNOSIS — N939 Abnormal uterine and vaginal bleeding, unspecified: Secondary | ICD-10-CM | POA: Diagnosis present

## 2021-12-30 DIAGNOSIS — N760 Acute vaginitis: Secondary | ICD-10-CM | POA: Diagnosis not present

## 2021-12-30 DIAGNOSIS — A5602 Chlamydial vulvovaginitis: Secondary | ICD-10-CM | POA: Diagnosis not present

## 2021-12-30 DIAGNOSIS — R1032 Left lower quadrant pain: Secondary | ICD-10-CM | POA: Insufficient documentation

## 2021-12-30 MED ORDER — NORETHINDRONE ACETATE 5 MG PO TABS: 5.0000 mg | ORAL_TABLET | Freq: Every day | ORAL | 2 refills | Status: DC

## 2021-12-30 NOTE — Progress Notes (Signed)
History:  56 y.o. G1P1001 here today for AUB. Pt had LnIUD placed in 04/2021. She reports reg menses that were light even with the IUD. She has never gone  year without menses. Her mother underwent menoauspe as age 34 years. Pt also reports pain in her LLQ. The bleeding has become heavy now. It is heaviest in the am and late afternoon. PAP and Endo bx WNL 04/2021  The following portions of the patient's history were reviewed and updated as appropriate: allergies, current medications, past family history, past medical history, past social history, past surgical history and problem list.  Review of Systems:  Pertinent items are noted in HPI.    Objective:  Physical Exam Blood pressure 127/89, pulse 77, height '5\' 4"'$  (1.626 m), weight 177 lb (80.3 kg), last menstrual period 12/16/2021.  CONSTITUTIONAL: Well-developed, well-nourished female in no acute distress.  HENT:  Normocephalic, atraumatic EYES: Conjunctivae and EOM are normal. No scleral icterus.  NECK: Normal range of motion SKIN: Skin is warm and dry. No rash noted. Not diaphoretic.No pallor. [place]: Alert and oriented to person, place, and time. Normal coordination.  Abd: Soft, nontender and nondistended; well healed transverse incision. Sl tender to palpation in LLQ Pelvic: Normal appearing external genitalia; normal appearing vaginal mucosa and cervix.  Normal discharge.  Uterus enlarged. 12 weeks sized; mobile. Left adnexal tenderness. No palpable masses, no uterine or adnexal tenderness. The IUD strings are noted. There is a small amount of blood in the vault. No CMT.   Labs and Imaging 04/22/2021 FINAL MICROSCOPIC DIAGNOSIS:   A. ENDOMETRIUM, BIOPSY:  - Benign endometrium with late proliferative and early secretory changes  - Negative for hyperplasia or malignancy   Assessment & Plan:  Diagnoses and all orders for this visit:  Abnormal uterine bleeding -     Cervicovaginal ancillary only( [place]) -     US  Transvaginal Non-OB; Future -     Cancel: US Pelvis Complete; Future -     norethindrone (AYGESTIN) 5 MG tablet; Take 1 tablet (5 mg total) by mouth daily. -     Follicle stimulating hormone -     CBC -     TSH -     US PELVIC COMPLETE W TRANSVAGINAL AND TORSION R/O; Future  Left lower quadrant abdominal pain -     US Transvaginal Non-OB; Future -     Cancel: US Pelvis Complete; Future -     CBC -     US PELVIC COMPLETE W TRANSVAGINAL AND TORSION R/O; Future   F/u in 4 weeks or sooner prn  Braydon Kullman L. Harraway-Smith, M.D., Cherlynn June

## 2021-12-30 NOTE — Progress Notes (Signed)
Had cycle on June 4- 14. Started bleeding again with clots on June 28th, 2023.Has IUD. Last Pap Nov 2022. Anderson Malta [facility]

## 2021-12-31 ENCOUNTER — Telehealth: Payer: Self-pay

## 2021-12-31 ENCOUNTER — Encounter: Payer: Self-pay | Admitting: Obstetrics & Gynecology

## 2021-12-31 LAB — CERVICOVAGINAL ANCILLARY ONLY
Bacterial Vaginitis (gardnerella): POSITIVE — AB
Candida Glabrata: NEGATIVE
Candida Vaginitis: NEGATIVE
Chlamydia: POSITIVE — AB
Comment: NEGATIVE
Comment: NEGATIVE
Comment: NEGATIVE
Comment: NEGATIVE
Comment: NORMAL
Neisseria Gonorrhea: NEGATIVE

## 2021-12-31 LAB — BASIC METABOLIC PANEL
BUN/Creatinine Ratio: 11 (ref 9–23)
BUN: 10 mg/dL (ref 6–24)
CO2: 21 mmol/L (ref 20–29)
Calcium: 9.6 mg/dL (ref 8.7–10.2)
Chloride: 104 mmol/L (ref 96–106)
Creatinine, Ser: 0.87 mg/dL (ref 0.57–1.00)
Glucose: 83 mg/dL (ref 70–99)
Potassium: 4.7 mmol/L (ref 3.5–5.2)
Sodium: 138 mmol/L (ref 134–144)
eGFR: 79 mL/min/{1.73_m2} (ref >59)

## 2021-12-31 NOTE — Telephone Encounter (Signed)
Patient called requesting [place] results. Pt made aware that a message will be sent to the provider. Understanding was voiced. Tracy Decker l Tracy Decker, CMA

## 2022-01-01 ENCOUNTER — Telehealth: Payer: Self-pay

## 2022-01-01 ENCOUNTER — Encounter: Payer: Self-pay | Admitting: Obstetrics & Gynecology

## 2022-01-01 ENCOUNTER — Other Ambulatory Visit: Payer: Self-pay

## 2022-01-01 ENCOUNTER — Other Ambulatory Visit: Payer: Self-pay | Admitting: Obstetrics & Gynecology

## 2022-01-01 ENCOUNTER — Telehealth: Payer: Self-pay | Admitting: Obstetrics & Gynecology

## 2022-01-01 ENCOUNTER — Ambulatory Visit (HOSPITAL_BASED_OUTPATIENT_CLINIC_OR_DEPARTMENT_OTHER): Payer: No Typology Code available for payment source | Admitting: Family

## 2022-01-01 DIAGNOSIS — A749 Chlamydial infection, unspecified: Secondary | ICD-10-CM

## 2022-01-01 DIAGNOSIS — B9689 Other specified bacterial agents as the cause of diseases classified elsewhere: Secondary | ICD-10-CM

## 2022-01-01 MED ORDER — DOXYCYCLINE HYCLATE 100 MG PO CAPS: 100.0000 mg | ORAL_CAPSULE | Freq: Two times a day (BID) | ORAL | 0 refills | Status: DC

## 2022-01-01 MED ORDER — FLUCONAZOLE 150 MG PO TABS: 150.0000 mg | ORAL_TABLET | Freq: Once | ORAL | 0 refills | Status: AC

## 2022-01-01 MED ORDER — METRONIDAZOLE 500 MG PO TABS: 500.0000 mg | ORAL_TABLET | Freq: Two times a day (BID) | ORAL | 0 refills | Status: DC

## 2022-01-01 MED ORDER — METRONIDAZOLE 0.75 % VA GEL: 1.0000 | Freq: Every day | VAGINAL | 5 refills | Status: DC

## 2022-01-01 MED ORDER — AZITHROMYCIN 500 MG PO TABS: 1000.0000 mg | ORAL_TABLET | Freq: Once | ORAL | 0 refills | Status: AC

## 2022-01-01 NOTE — Telephone Encounter (Signed)
I connected with  Tracy Decker on 01/01/22 by telephone and verified that I am speaking with the correct person using two identifiers.   Pt given lab results and advised to let partner(s) know and abstain from intercourse for 3 weeks. Pt also informed that she should not drink alcohol while taking this medication.    Pt has no further questions.

## 2022-01-01 NOTE — Telephone Encounter (Signed)
TC to pt re labs. Left message on voice mail.   Clh-S Hoyle Sauer L. Harraway-Smith, M.D., Cherlynn June

## 2022-01-07 ENCOUNTER — Telehealth: Payer: Self-pay | Admitting: Obstetrics & Gynecology

## 2022-01-07 NOTE — Telephone Encounter (Signed)
TC to pt. Left message. Returned pts call.    Nechama Escutia L. Harraway-Smith, M.D., Cherlynn June

## 2022-01-08 ENCOUNTER — Ambulatory Visit (HOSPITAL_BASED_OUTPATIENT_CLINIC_OR_DEPARTMENT_OTHER): Payer: No Typology Code available for payment source | Admitting: Family

## 2022-01-13 ENCOUNTER — Ambulatory Visit: Payer: No Typology Code available for payment source | Admitting: Obstetrics & Gynecology

## 2022-01-13 ENCOUNTER — Encounter: Payer: Self-pay | Admitting: Obstetrics & Gynecology

## 2022-01-13 DIAGNOSIS — B3731 Acute candidiasis of vulva and vagina: Secondary | ICD-10-CM

## 2022-01-13 DIAGNOSIS — N83202 Unspecified ovarian cyst, left side: Secondary | ICD-10-CM

## 2022-01-13 DIAGNOSIS — N939 Abnormal uterine and vaginal bleeding, unspecified: Secondary | ICD-10-CM

## 2022-01-13 MED ORDER — FLUCONAZOLE 150 MG PO TABS: 150.0000 mg | ORAL_TABLET | Freq: Once | ORAL | 0 refills | Status: AC

## 2022-01-13 NOTE — Progress Notes (Signed)
Patient presents following up from imaging. Tracy Alu  RN

## 2022-01-13 NOTE — Progress Notes (Signed)
History:  56 y.o. G1P1001 here today for f/u of AUB and abd pain. Pt is s/p treatment for chlamydia and her sx have improved. No bleeding noted and no pain has resolved. She does report that she has itching now from below and is worried that the yeast infxn has not cleared up post atbx. Her partner of 2 years ad not been treated. She is requesting treatment for him. Her reg menses are due any day.     The following portions of the patient's history were reviewed and updated as appropriate: allergies, current medications, past family history, past medical history, past social history, past surgical history and problem list.  Review of Systems:  Pertinent items are noted in HPI.    Objective:  Physical Exam Blood pressure 122/80, pulse 90, height '5\' 4"'$  (1.626 m), weight 170 lb (77.1 kg), last menstrual period 12/16/2021.  CONSTITUTIONAL: Well-developed, well-nourished female in no acute distress.  HENT:  Normocephalic, atraumatic EYES: Conjunctivae and EOM are normal. No scleral icterus.  NECK: Normal range of motion SKIN: Skin is warm and dry. No rash noted. Not diaphoretic.No pallor. [place]: Alert and oriented to person, place, and time. Normal coordination.  Abd: Soft, nontender and nondistended; prev tenderness resolved.  Pelvic: not indicated.   Labs and Imaging US PELVIC COMPLETE W TRANSVAGINAL AND TORSION R/O  Result Date: 12/31/2021 CLINICAL DATA:  Abnormal uterine bleeding EXAM: TRANSABDOMINAL AND TRANSVAGINAL ULTRASOUND OF PELVIS DOPPLER ULTRASOUND OF OVARIES TECHNIQUE: Both transabdominal and transvaginal ultrasound examinations of the pelvis were performed. Transabdominal technique was performed for global imaging of the pelvis including uterus, ovaries, adnexal regions, and pelvic cul-de-sac. It was necessary to proceed with endovaginal exam following the transabdominal exam to visualize the uterus endometrium ovaries. Color and duplex Doppler ultrasound was utilized to evaluate  blood flow to the ovaries. COMPARISON:  04/24/2020 FINDINGS: Uterus Measurements: 7.4 x 5.2 x 7.8 cm = volume: 157 mL. Multiple uterine fibroids. Posterior uterine corpus fibroid measuring 3.5 x 2.6 by 2.4 cm. Left lower uterine segment fibroid measuring 2.6 x 1.4 x 2.4 cm. Left anterior uterine corpus fibroid measuring 2.3 x 1.1 x 3.1 cm. Small exophytic fibroid off the uterine fundus on the right measuring 1.7 x 1.4 x 1.5 cm. Endometrium Thickness: 6.3 mm.  IUD within the upper uterine segment Right ovary Measurements: 2.4 x 1.4 x 2.5 cm = volume: 4.4 mL. Normal appearance/no adnexal mass. Left ovary Measurements: 5.3 x 3.6 x 4 cm = volume: 39.9 mL. Cyst containing single septation measures 3.1 x 2.6 x 2.4 cm. No follow up imaging recommended. Note: This recommendation does not apply to premenarchal patients or to those with increased risk (genetic, family history, elevated tumor markers or other high-risk factors) of ovarian cancer. Reference: Radiology 2019 Nov; 293(2):359-371. Pulsed Doppler evaluation of both ovaries demonstrates normal low-resistance arterial and venous waveforms. Other findings Trace free fluid IMPRESSION: 1. No evidence for ovarian torsion 2. Multiple uterine fibroids. IUD appears grossly appropriate in position by sonography. Normal premenopausal endometrial thickness 3. Trace free fluid Electronically Signed   By: Donavan Foil M.D.   On: 12/31/2021 00:30    Assessment & Plan:  Diagnoses and all orders for this visit:  Yeast vaginitis  Abnormal uterine bleeding (AUB)  Cyst of left ovary   Diflucan 1 po x1 F/u in 2 weeks for TOC  Morrison Mcbryar L. Harraway-Smith, M.D., Cherlynn June

## 2022-01-14 LAB — CBC
Hematocrit: 40.3 % (ref 34.0–46.6)
Hemoglobin: 13.7 g/dL (ref 11.1–15.9)
MCH: 31.4 pg (ref 26.6–33.0)
MCHC: 34 g/dL (ref 31.5–35.7)
MCV: 92 fL (ref 79–97)
Platelets: 429 10*3/uL (ref 150–450)
RBC: 4.36 x10E6/uL (ref 3.77–5.28)
RDW: 14.1 % (ref 11.7–15.4)
WBC: 5.3 10*3/uL (ref 3.4–10.8)

## 2022-01-14 LAB — TSH: TSH: 0.924 u[IU]/mL (ref 0.450–4.500)

## 2022-01-14 LAB — FOLLICLE STIMULATING HORMONE: FSH: 3.7 m[IU]/mL

## 2022-01-27 ENCOUNTER — Ambulatory Visit: Payer: No Typology Code available for payment source

## 2022-05-26 ENCOUNTER — Other Ambulatory Visit: Payer: Self-pay | Admitting: Obstetrics & Gynecology

## 2022-05-26 ENCOUNTER — Other Ambulatory Visit: Payer: Self-pay

## 2022-05-26 DIAGNOSIS — B9689 Other specified bacterial agents as the cause of diseases classified elsewhere: Secondary | ICD-10-CM

## 2022-05-26 MED ORDER — METRONIDAZOLE 0.75 % VA GEL: 1.0000 | Freq: Every day | VAGINAL | 5 refills | Status: AC

## 2022-10-04 ENCOUNTER — Encounter: Payer: Self-pay | Admitting: *Deleted

## 2023-01-12 ENCOUNTER — Ambulatory Visit: Payer: No Typology Code available for payment source | Admitting: Obstetrics & Gynecology

## 2023-04-18 ENCOUNTER — Inpatient Hospital Stay (HOSPITAL_COMMUNITY)
Admission: EM | Admit: 2023-04-18 | Discharge: 2023-04-19 | DRG: 309 | Disposition: A | Discharge status: Home / Self Care | Payer: No Typology Code available for payment source | Attending: Internal Medicine | Admitting: Internal Medicine

## 2023-04-18 ENCOUNTER — Emergency Department (HOSPITAL_COMMUNITY): Payer: No Typology Code available for payment source

## 2023-04-18 ENCOUNTER — Other Ambulatory Visit: Payer: Self-pay

## 2023-04-18 ENCOUNTER — Inpatient Hospital Stay (HOSPITAL_COMMUNITY): Payer: No Typology Code available for payment source

## 2023-04-18 ENCOUNTER — Encounter (HOSPITAL_COMMUNITY): Payer: Self-pay | Admitting: Internal Medicine

## 2023-04-18 ENCOUNTER — Other Ambulatory Visit (HOSPITAL_COMMUNITY): Payer: Self-pay

## 2023-04-18 DIAGNOSIS — Z79899 Other long term (current) drug therapy: Secondary | ICD-10-CM

## 2023-04-18 DIAGNOSIS — I4891 Unspecified atrial fibrillation: Secondary | ICD-10-CM

## 2023-04-18 DIAGNOSIS — E269 Hyperaldosteronism, unspecified: Secondary | ICD-10-CM | POA: Diagnosis present

## 2023-04-18 DIAGNOSIS — I119 Hypertensive heart disease without heart failure: Secondary | ICD-10-CM

## 2023-04-18 DIAGNOSIS — I2510 Atherosclerotic heart disease of native coronary artery without angina pectoris: Secondary | ICD-10-CM | POA: Diagnosis present

## 2023-04-18 DIAGNOSIS — Z79890 Hormone replacement therapy (postmenopausal): Secondary | ICD-10-CM | POA: Diagnosis not present

## 2023-04-18 DIAGNOSIS — Z8249 Family history of ischemic heart disease and other diseases of the circulatory system: Secondary | ICD-10-CM

## 2023-04-18 DIAGNOSIS — I252 Old myocardial infarction: Secondary | ICD-10-CM | POA: Diagnosis not present

## 2023-04-18 DIAGNOSIS — E896 Postprocedural adrenocortical (-medullary) hypofunction: Secondary | ICD-10-CM | POA: Diagnosis present

## 2023-04-18 DIAGNOSIS — G4700 Insomnia, unspecified: Secondary | ICD-10-CM | POA: Diagnosis present

## 2023-04-18 DIAGNOSIS — E785 Hyperlipidemia, unspecified: Secondary | ICD-10-CM | POA: Diagnosis present

## 2023-04-18 DIAGNOSIS — I10 Essential (primary) hypertension: Secondary | ICD-10-CM | POA: Diagnosis present

## 2023-04-18 DIAGNOSIS — I480 Paroxysmal atrial fibrillation: Principal | ICD-10-CM | POA: Diagnosis present

## 2023-04-18 DIAGNOSIS — Z7985 Long-term (current) use of injectable non-insulin antidiabetic drugs: Secondary | ICD-10-CM

## 2023-04-18 DIAGNOSIS — Z6830 Body mass index (BMI) 30.0-30.9, adult: Secondary | ICD-10-CM | POA: Diagnosis not present

## 2023-04-18 DIAGNOSIS — D3501 Benign neoplasm of right adrenal gland: Secondary | ICD-10-CM

## 2023-04-18 DIAGNOSIS — T502X6A Underdosing of carbonic-anhydrase inhibitors, benzothiadiazides and other diuretics, initial encounter: Secondary | ICD-10-CM | POA: Diagnosis present

## 2023-04-18 DIAGNOSIS — I700 Atherosclerosis of aorta: Secondary | ICD-10-CM | POA: Diagnosis present

## 2023-04-18 DIAGNOSIS — F411 Generalized anxiety disorder: Secondary | ICD-10-CM | POA: Insufficient documentation

## 2023-04-18 DIAGNOSIS — Z886 Allergy status to analgesic agent status: Secondary | ICD-10-CM

## 2023-04-18 DIAGNOSIS — Z8616 Personal history of COVID-19: Secondary | ICD-10-CM

## 2023-04-18 DIAGNOSIS — Z8679 Personal history of other diseases of the circulatory system: Secondary | ICD-10-CM | POA: Diagnosis not present

## 2023-04-18 DIAGNOSIS — K5909 Other constipation: Secondary | ICD-10-CM | POA: Diagnosis present

## 2023-04-18 DIAGNOSIS — R0789 Other chest pain: Secondary | ICD-10-CM | POA: Diagnosis present

## 2023-04-18 DIAGNOSIS — Z7982 Long term (current) use of aspirin: Secondary | ICD-10-CM | POA: Diagnosis not present

## 2023-04-18 DIAGNOSIS — I1A0 Resistant hypertension: Secondary | ICD-10-CM | POA: Diagnosis present

## 2023-04-18 DIAGNOSIS — R079 Chest pain, unspecified: Secondary | ICD-10-CM | POA: Diagnosis present

## 2023-04-18 DIAGNOSIS — E669 Obesity, unspecified: Secondary | ICD-10-CM | POA: Insufficient documentation

## 2023-04-18 HISTORY — DX: Other pericardial effusion (noninflammatory): I31.39

## 2023-04-18 HISTORY — DX: Atherosclerotic heart disease of native coronary artery without angina pectoris: I25.10

## 2023-04-18 HISTORY — DX: Obesity, unspecified: E66.9

## 2023-04-18 HISTORY — DX: Cardiomegaly: I51.7

## 2023-04-18 HISTORY — DX: Atherosclerosis of aorta: I70.0

## 2023-04-18 LAB — CBC
HCT: 43.7 % (ref 36.0–46.0)
HCT: 42.1 % (ref 36.0–46.0)
Hemoglobin: 14.5 g/dL (ref 12.0–15.0)
Hemoglobin: 14.1 g/dL (ref 12.0–15.0)
MCH: 32.2 pg (ref 26.0–34.0)
MCH: 31.8 pg (ref 26.0–34.0)
MCHC: 33.2 g/dL (ref 30.0–36.0)
MCHC: 33.5 g/dL (ref 30.0–36.0)
MCV: 96.9 fL (ref 80.0–100.0)
MCV: 94.8 fL (ref 80.0–100.0)
Platelets: 327 10*3/uL (ref 150–400)
Platelets: 333 10*3/uL (ref 150–400)
RBC: 4.51 MIL/uL (ref 3.87–5.11)
RBC: 4.44 MIL/uL (ref 3.87–5.11)
RDW: 13 % (ref 11.5–15.5)
RDW: 12.8 % (ref 11.5–15.5)
WBC: 9.7 10*3/uL (ref 4.0–10.5)
WBC: 8.6 10*3/uL (ref 4.0–10.5)
nRBC: 0 % (ref 0.0–0.2)
nRBC: 0 % (ref 0.0–0.2)

## 2023-04-18 LAB — ECHOCARDIOGRAM COMPLETE
AR max vel: 2.99 cm2
AV Area VTI: 2.79 cm2
AV Area mean vel: 2.97 cm2
AV Mean grad: 6 mm[Hg]
AV Peak grad: 11.5 mm[Hg]
Ao pk vel: 1.69 m/s
Area-P 1/2: 3.27 cm2
Height: 64 in
S' Lateral: 1.9 cm
Weight: 2973.56 [oz_av]

## 2023-04-18 LAB — MRSA NEXT GEN BY PCR, NASAL: MRSA by PCR Next Gen: NOT DETECTED

## 2023-04-18 LAB — COMPREHENSIVE METABOLIC PANEL
ALT: 11 U/L (ref 0–44)
ALT: 12 U/L (ref 0–44)
AST: 13 U/L — ABNORMAL LOW (ref 15–41)
AST: 12 U/L — ABNORMAL LOW (ref 15–41)
Albumin: 3.9 g/dL (ref 3.5–5.0)
Albumin: 3.6 g/dL (ref 3.5–5.0)
Alkaline Phosphatase: 61 U/L (ref 38–126)
Alkaline Phosphatase: 54 U/L (ref 38–126)
Anion gap: 12 (ref 5–15)
Anion gap: 8 (ref 5–15)
BUN: 16 mg/dL (ref 6–20)
BUN: 15 mg/dL (ref 6–20)
CO2: 20 mmol/L — ABNORMAL LOW (ref 22–32)
CO2: 22 mmol/L (ref 22–32)
Calcium: 9.4 mg/dL (ref 8.9–10.3)
Calcium: 8.9 mg/dL (ref 8.9–10.3)
Chloride: 105 mmol/L (ref 98–111)
Chloride: 108 mmol/L (ref 98–111)
Creatinine, Ser: 0.93 mg/dL (ref 0.44–1.00)
Creatinine, Ser: 0.85 mg/dL (ref 0.44–1.00)
GFR, Estimated: >60 mL/min (ref >60)
GFR, Estimated: >60 mL/min (ref >60)
Glucose, Bld: 121 mg/dL — ABNORMAL HIGH (ref 70–99)
Glucose, Bld: 113 mg/dL — ABNORMAL HIGH (ref 70–99)
Potassium: 4.2 mmol/L (ref 3.5–5.1)
Potassium: 4.2 mmol/L (ref 3.5–5.1)
Sodium: 137 mmol/L (ref 135–145)
Sodium: 138 mmol/L (ref 135–145)
Total Bilirubin: 0.7 mg/dL (ref 0.3–1.2)
Total Bilirubin: 0.6 mg/dL (ref 0.3–1.2)
Total Protein: 7.3 g/dL (ref 6.5–8.1)
Total Protein: 6.9 g/dL (ref 6.5–8.1)

## 2023-04-18 LAB — TROPONIN I (HIGH SENSITIVITY)
Troponin I (High Sensitivity): 17 ng/L (ref <18)
Troponin I (High Sensitivity): 36 ng/L — ABNORMAL HIGH (ref <18)
Troponin I (High Sensitivity): 34 ng/L — ABNORMAL HIGH (ref <18)

## 2023-04-18 LAB — PROTIME-INR
INR: 0.8 (ref 0.8–1.2)
Prothrombin Time: 11.5 s (ref 11.4–15.2)

## 2023-04-18 LAB — HIV ANTIBODY (ROUTINE TESTING W REFLEX): HIV Screen 4th Generation wRfx: NONREACTIVE

## 2023-04-18 LAB — TSH: TSH: 2.288 u[IU]/mL (ref 0.350–4.500)

## 2023-04-18 LAB — HEMOGLOBIN A1C
Hgb A1c MFr Bld: 5.7 % — ABNORMAL HIGH (ref 4.8–5.6)
Mean Plasma Glucose: 116.89 mg/dL

## 2023-04-18 LAB — HCG, SERUM, QUALITATIVE: Preg, Serum: NEGATIVE

## 2023-04-18 MED ORDER — DILTIAZEM HCL-DEXTROSE 125-5 MG/125ML-% IV SOLN (PREMIX)
5.0000 mg/h | INTRAVENOUS | Status: DC
  Administered 2023-04-18: 5 mg/h via INTRAVENOUS
  Filled 2023-04-18: qty 125

## 2023-04-18 MED ORDER — ESCITALOPRAM OXALATE 10 MG PO TABS
20.0000 mg | ORAL_TABLET | Freq: Every day | ORAL | Status: DC
  Administered 2023-04-18 – 2023-04-19 (×2): 20 mg via ORAL
  Filled 2023-04-18 (×2): qty 2

## 2023-04-18 MED ORDER — HEPARIN (PORCINE) 25000 UT/250ML-% IV SOLN
1100.0000 [IU]/h | INTRAVENOUS | Status: DC
  Administered 2023-04-18: 1100 [IU]/h via INTRAVENOUS
  Filled 2023-04-18 (×2): qty 250

## 2023-04-18 MED ORDER — ASPIRIN 81 MG PO TBEC
81.0000 mg | DELAYED_RELEASE_TABLET | Freq: Every day | ORAL | Status: DC
  Administered 2023-04-18: 81 mg via ORAL
  Filled 2023-04-18: qty 1

## 2023-04-18 MED ORDER — CHLORHEXIDINE GLUCONATE CLOTH 2 % EX PADS
6.0000 | MEDICATED_PAD | Freq: Every day | CUTANEOUS | Status: AC
  Administered 2023-04-18: 6 via TOPICAL

## 2023-04-18 MED ORDER — SENNOSIDES-DOCUSATE SODIUM 8.6-50 MG PO TABS: 1.0000 | ORAL_TABLET | Freq: Every evening | ORAL | Status: DC | PRN

## 2023-04-18 MED ORDER — DILTIAZEM LOAD VIA INFUSION
10.0000 mg | Freq: Once | INTRAVENOUS | Status: AC
  Administered 2023-04-18: 10 mg via INTRAVENOUS
  Filled 2023-04-18: qty 10

## 2023-04-18 MED ORDER — METOPROLOL SUCCINATE ER 50 MG PO TB24
50.0000 mg | ORAL_TABLET | Freq: Two times a day (BID) | ORAL | Status: DC
  Administered 2023-04-18 – 2023-04-19 (×2): 50 mg via ORAL
  Filled 2023-04-18 (×2): qty 1

## 2023-04-18 MED ORDER — ACETAMINOPHEN 650 MG RE SUPP: 650.0000 mg | Freq: Four times a day (QID) | RECTAL | Status: DC | PRN

## 2023-04-18 MED ORDER — SODIUM CHLORIDE 0.9% FLUSH: 3.0000 mL | Freq: Two times a day (BID) | INTRAVENOUS | Status: DC

## 2023-04-18 MED ORDER — SODIUM CHLORIDE 0.9% FLUSH
3.0000 mL | Freq: Two times a day (BID) | INTRAVENOUS | Status: DC
  Administered 2023-04-18 – 2023-04-19 (×2): 3 mL via INTRAVENOUS

## 2023-04-18 MED ORDER — BUSPIRONE HCL 10 MG PO TABS: 5.0000 mg | ORAL_TABLET | Freq: Every day | ORAL | Status: DC | PRN

## 2023-04-18 MED ORDER — PANTOPRAZOLE SODIUM 40 MG PO TBEC
40.0000 mg | DELAYED_RELEASE_TABLET | Freq: Every day | ORAL | Status: DC
  Administered 2023-04-18 – 2023-04-19 (×2): 40 mg via ORAL
  Filled 2023-04-18 (×2): qty 1

## 2023-04-18 MED ORDER — SODIUM CHLORIDE 0.9 % IV SOLN: INTRAVENOUS | Status: DC

## 2023-04-18 MED ORDER — METOPROLOL SUCCINATE ER 50 MG PO TB24
50.0000 mg | ORAL_TABLET | Freq: Every day | ORAL | Status: DC
  Administered 2023-04-18: 50 mg via ORAL
  Filled 2023-04-18: qty 1

## 2023-04-18 MED ORDER — SODIUM CHLORIDE 0.9% FLUSH
3.0000 mL | INTRAVENOUS | Status: DC | PRN
Start: 2023-04-18 — End: 2023-04-18

## 2023-04-18 MED ORDER — APIXABAN 5 MG PO TABS
5.0000 mg | ORAL_TABLET | Freq: Two times a day (BID) | ORAL | Status: DC
  Administered 2023-04-18 – 2023-04-19 (×3): 5 mg via ORAL
  Filled 2023-04-18 (×3): qty 1

## 2023-04-18 MED ORDER — ROSUVASTATIN CALCIUM 20 MG PO TABS
40.0000 mg | ORAL_TABLET | Freq: Every day | ORAL | Status: DC
  Administered 2023-04-18 – 2023-04-19 (×2): 40 mg via ORAL
  Filled 2023-04-18 (×2): qty 2

## 2023-04-18 MED ORDER — IRBESARTAN 150 MG PO TABS
300.0000 mg | ORAL_TABLET | Freq: Every day | ORAL | Status: DC
  Administered 2023-04-18 – 2023-04-19 (×2): 300 mg via ORAL
  Filled 2023-04-18 (×2): qty 2

## 2023-04-18 MED ORDER — HEPARIN BOLUS VIA INFUSION
4000.0000 [IU] | Freq: Once | INTRAVENOUS | Status: AC
  Administered 2023-04-18: 4000 [IU] via INTRAVENOUS
  Filled 2023-04-18: qty 4000

## 2023-04-18 MED ORDER — BUSPIRONE HCL 10 MG PO TABS
5.0000 mg | ORAL_TABLET | Freq: Every day | ORAL | Status: DC
  Administered 2023-04-19: 5 mg via ORAL
  Filled 2023-04-18 (×2): qty 1

## 2023-04-18 MED ORDER — SODIUM CHLORIDE 0.9 % IV SOLN
250.0000 mL | INTRAVENOUS | Status: DC | PRN
Start: 2023-04-18 — End: 2023-04-18

## 2023-04-18 MED ORDER — ACETAMINOPHEN 325 MG PO TABS
650.0000 mg | ORAL_TABLET | Freq: Four times a day (QID) | ORAL | Status: DC | PRN
  Administered 2023-04-18: 650 mg via ORAL
  Filled 2023-04-18: qty 2

## 2023-04-18 MED ORDER — ENOXAPARIN SODIUM 40 MG/0.4ML IJ SOSY: 40.0000 mg | PREFILLED_SYRINGE | INTRAMUSCULAR | Status: DC

## 2023-04-18 NOTE — Progress Notes (Signed)
Courtesy note:  Patient is seen and examined today morning. She is admitted for evaluation of palpitations, new onset Afib with RVR.  Patient is started on IV Cardizem drip, heparin drip and cardiology consulted.  Patient does have history of hypertension, hyperaldosteronism, bilateral adrenal hyperplasia, generalized anxiety disorder, hyperlipidemia, nonobstructive CAD.   Catecholamines/ metanephrines, echocardiogram pending. Today during my exam patient's heart rate controlled in the 80s.  I restarted home dose metoprolol. Cardiology team evaluated the patient recommended to increase metoprolol dose, stop Cardizem.  Given chadvasc score of 3 she will need oral anticoagulation with plan for TEE cardioversion tomorrow.  Further management depends on the clinical course.

## 2023-04-18 NOTE — H&P (Addendum)
History and Physical    Tracy Decker ZOX:[id number] DOB: 04/14/1966 DOA: 04/18/2023  PCP: Truett Perna, MD   Patient coming from: Home   Chief Complaint:  Chief Complaint  Patient presents with   Chest Pain   ED TRIAGE note:Pt arrives to ED c/o CP that radiates to left shoulder and jaw. Pt with hx of MI. Pt with A-fib RVR per EMS. Pt with no hx of this. Pt with HR 80-200. Pt given of NS   HPI:  Tracy Decker is a 57 y.o. 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  Patient reported that she has having sensation of palpitations/aching in her chest.  Found to be A-fib RVR with EMS.  Patient was given 100 cc of normal saline and no added medication. At sensation to ED patient complaining about shortness of breath. Patient has history of bilateral adrenal hyperplasia/adenoma status post left-sided adenectomy in the past however patient is still has right-sided adrenal hyperplasia and adenoma.  No established outpatient endocrinology care.  Patient has establish care with cardiology last clinic visit in May 2023.  She used to follow cardiology for management for hypertension and nonbreast obstructing CAD.  Currently at home patient is on aspirin, statin, chlorthalidone, carvedilol and spironolactone. Per chart review, hospitalization from 06/2020 reviewed. Noted to have mild, largely flat elevation in hsTnI (104, 122, 153). CT cardiac with Ca score of 3 and minimal nonobstructive CAD. Echo hyperdynamic, LVH noted.  My evaluation at bedside patient reported that she has intermittent episodes of chest pain that started around  1 PM tonight.  It feels like that her heart is moving around and having palpitation sensation.  Patient denies radiation of the chest pain to the left side of the jaw, neck.  Denies any diaphoresis.  Patient is telling me when she was in [place] she used to have a nephrologist who was taking care of okay I do not problem.  She has left-sided adrenal gland removal in 2013 while in [place] and currently hyperaldosteronism/right-sided adrenal gland adenoma is managing by primary care provider at [facility].  Patient does not have any established endocrine or nephrology over ER at [facility].  She has been relocated 3 years ago at [facility]. Patient denies any headache, nausea, abdominal pain and diarrhea.  She is endorsing chronic constipation.  She also has nighttime sweat.  ED Course:  At ED on presentation heart rate was 133 which has been improved to 92.  Blood pressure 152/96, respiratory 24 and O2 sat 100% room air. CMP grossly unremarkable except low bicarb 20. CBC unremarkable. TSH within normal range. Troponin 17. EKG showed atrial fibrillation RVR 155.  In the ED patient was given Cardizem 10 mg IV followed by initiation of Cardizem drip. ED physician consulted on-call cardiologist Dr. Orson Aloe who agrees to continue the Cardizem drip.  Per ED physician Dr.Kommor possibly have some adrenal drive associated A-fib RVR.  Hospitalist has been contacted for further evaluation and management of A-fib RVR.  Review of Systems:  Review of Systems  Constitutional:  Negative for chills, diaphoresis, fever, malaise/fatigue and weight loss.  Respiratory:  Negative for shortness of breath.   Cardiovascular:  Positive for chest pain and palpitations. Negative for orthopnea and leg swelling.  Gastrointestinal:  Negative for  abdominal pain, constipation, diarrhea, heartburn, nausea and vomiting.  Musculoskeletal:  Negative for neck pain.  Neurological:  Negative for dizziness, tremors and  headaches.  Psychiatric/Behavioral:  The patient is not nervous/anxious.     Past Medical History:  Diagnosis Date   Hypertension    Renal disorder     Past Surgical History:  Procedure Laterality Date   ADRENAL GLAND SURGERY Bilateral      reports that she has never smoked. She has never used smokeless tobacco. She reports that she does not currently use alcohol. She reports that she does not use drugs.  Allergies  Allergen Reactions   Ibuprofen Other (See Comments)    Causes increase in BP    Family History  Problem Relation Age of Onset   Breast cancer Maternal Grandmother     Prior to Admission medications   Medication Sig Start Date End Date Taking? Authorizing Provider  amLODipine (NORVASC) 10 MG tablet Take 10 mg by mouth daily.    [provider]  aspirin EC 81 MG EC tablet Take 1 tablet (81 mg total) by mouth daily. Swallow whole. 07/18/20   Regalado, Belkys A, MD  buPROPion (WELLBUTRIN) 100 MG tablet Take by mouth.    [provider]  Cholecalciferol (VITAMIN D3) 50 MCG (2000 UT) CAPS Take 2,000 Units by mouth daily.    [provider]  doxycycline (VIBRAMYCIN) 100 MG capsule Take 1 capsule (100 mg total) by mouth 2 (two) times daily. 01/01/22   Willodean Rosenthal, MD  escitalopram (LEXAPRO) 20 MG tablet Take by mouth.    [provider]  Fluocinolone Acetonide Scalp 0.01 % OIL Apply 1 application topically daily as needed for irritation. 02/01/20   [provider]  hydrochlorothiazide (MICROZIDE) 12.5 MG capsule Take 12.5 mg by mouth daily. 06/16/20   [provider]  metoprolol succinate (TOPROL-XL) 50 MG 24 hr tablet Take by mouth. 07/28/20   [provider]  metroNIDAZOLE (METROGEL) 0.75 % vaginal gel Place 1 Applicatorful vaginally at bedtime. Apply one applicatorful to vagina at bedtime for 10 days, then twice a week for 6 months. 05/26/22   Willodean Rosenthal, MD  minoxidil (LONITEN) 2.5 MG  tablet Take 5 mg by mouth daily.    [provider]  norethindrone (AYGESTIN) 5 MG tablet Take 1 tablet (5 mg total) by mouth daily. 12/30/21   Willodean Rosenthal, MD  OZEMPIC, 1 MG/DOSE, 4 MG/3ML SOPN Inject 1 mg into the skin once a week. 08/21/21   [provider]  pantoprazole (PROTONIX) 40 MG tablet Take 1 tablet (40 mg total) by mouth daily. 07/18/20   Regalado, Belkys A, MD  rosuvastatin (CRESTOR) 40 MG tablet Take 1 tablet (40 mg total) by mouth daily. 07/18/20   Regalado, Belkys A, MD  spironolactone (ALDACTONE) 50 MG tablet Take 50 mg by mouth daily.    [provider]  SUMAtriptan (IMITREX) 50 MG tablet Take by mouth.    [provider]  traMADol (ULTRAM) 50 MG tablet Take 1 tablet (50 mg total) by mouth every 6 (six) hours as needed for severe pain. 04/27/21   Willodean Rosenthal, MD  valsartan (DIOVAN) 320 MG tablet Take 1 tablet (320 mg total) by mouth daily. 11/03/21   Jodelle Red, MD  zolpidem (AMBIEN) 10 MG tablet Take 10 mg by mouth at bedtime as needed for sleep. 07/09/20   [provider]     Physical Exam: Vitals:   04/18/23 0315 04/18/23 0400 04/18/23 0430 04/18/23 0550  BP:  114/77 120/84 133/77  Pulse: 92 99 (!) 105 95  Resp: 18 19 17 10   Temp:    98.6 F (37 C)  TempSrc:    Oral  SpO2: 99% 99% 97% 95%  Weight:    84.3 kg  Height:        Physical Exam Constitutional:      General: She is not in acute distress.    Appearance: She is not ill-appearing.  Cardiovascular:     Rate and Rhythm: Tachycardia present. Rhythm irregular.     Heart sounds: Normal heart sounds. No murmur heard.    No diastolic murmur is present.  Pulmonary:     Effort: Pulmonary effort is normal.     Breath sounds: Normal breath sounds.  Abdominal:     General: Bowel sounds are normal. There is no abdominal bruit.  Musculoskeletal:        General: Normal range of motion.     Cervical back: Neck supple.  Skin:    Capillary  Refill: Capillary refill takes less than 2 seconds.  Neurological:     Mental Status: She is oriented to person, place, and time.      Labs on Admission: I have personally reviewed following labs and imaging studies  CBC: Recent Labs  Lab 04/18/23 0215 04/18/23 0452  WBC 9.7 8.6  HGB 14.5 14.1  HCT 43.7 42.1  MCV 96.9 94.8  PLT 327 333   Basic Metabolic Panel: Recent Labs  Lab 04/18/23 0215 04/18/23 0452  NA 137 138  K 4.2 4.2  CL 105 108  CO2 20* 22  GLUCOSE 121* 113*  BUN 16 15  CREATININE 0.93 0.85  CALCIUM 9.4 8.9   GFR: Estimated Creatinine Clearance: 77.6 mL/min (by C-G formula based on SCr of 0.85 mg/dL). Liver Function Tests: Recent Labs  Lab 04/18/23 0215 04/18/23 0452  AST 13* 12*  ALT 11 12  ALKPHOS 61 54  BILITOT 0.7 0.6  PROT 7.3 6.9  ALBUMIN 3.9 3.6   No results for input(s): "LIPASE", "AMYLASE" in the last 168 hours. No results for input(s): "AMMONIA" in the last 168 hours. Coagulation Profile: Recent Labs  Lab 04/18/23 0215  INR 0.8   Cardiac Enzymes: Recent Labs  Lab 04/18/23 0215 04/18/23 0452  TROPONINIHS 17 36*   BNP (last 3 results) No results for input(s): "BNP" in the last 8760 hours. HbA1C: No results for input(s): "HGBA1C" in the last 72 hours. CBG: No results for input(s): "GLUCAP" in the last 168 hours. Lipid Profile: No results for input(s): "CHOL", "HDL", "LDLCALC", "TRIG", "CHOLHDL", "LDLDIRECT" in the last 72 hours. Thyroid Function Tests: Recent Labs    04/18/23 0215  TSH 2.288   Anemia Panel: No results for input(s): "VITAMINB12", "FOLATE", "FERRITIN", "TIBC", "IRON", "RETICCTPCT" in the last 72 hours. Urine analysis: No results found for: "COLORURINE", "APPEARANCEUR", "LABSPEC", "PHURINE", "GLUCOSEU", "HGBUR", "BILIRUBINUR", "KETONESUR", "PROTEINUR", "UROBILINOGEN", "NITRITE", "LEUKOCYTESUR"  Radiological Exams on Admission: I have personally reviewed images DG Chest Portable 1 View  Result Date:  04/18/2023 CLINICAL DATA:  Chest pain EXAM: PORTABLE CHEST 1 VIEW COMPARISON:  07/15/2020 FINDINGS: The heart size and mediastinal contours are within normal limits. Both lungs are clear. The visualized skeletal structures are unremarkable. IMPRESSION: No active disease. Electronically Signed   By: Minerva Fester M.D.   On: 04/18/2023 02:31    EKG: My personal interpretation of EKG shows:      Assessment/Plan: Principal Problem:   Atrial fibrillation with RVR (HCC) Active Problems:   History of  CAD (coronary artery disease)   Adrenal adenoma, right   Chest pain   Essential hypertension   GAD (generalized anxiety disorder)    Assessment and Plan: A-fib RVR -Patient presenting with palpitation, chest pressure/chest pain which started tonight.  At initial presentation to ED patient's heart rate found 130.  EKG showed A-fib RVR.  CMP showed normal electrolytes.  TSH within normal range.- Patient reported left adrenal adenoma removal in 2013 at [place].  She never had any endocrinologist before.  Used to see nephrologist at [facility] and currently managing by primary care provider and cardiologist at [facility]  Given patient has history of hyperaldosteronism and right-sided abdominal adenoma concern for adrenergic riven A-fib RVR.  Checking urine metanephrine and urine catecholamine level. - ED physician consulted cardiology Dr. Orson Aloe who recommended to give Cardizem bolus followed by initiating Cardizem drip. - Currently patient is on Cardizem drip.  Heart rate has been improved to around 90-110 however still having atrial fibrillation. -Patient's CHA2DS2-VASc score 2.  As patient is still having atrial fibrillation initiating heparin drip to prevent stroke. - Patient reported left adrenal adenoma removal in 2013 at [place].  She never had any endocrinologist before.  Used to see nephrologist at [facility] and currently managing by primary care provider and cardiologist at [facility].  Given  patient has history of hyperaldosteronism and right-sided abdominal adenoma concern for adrenergic riven A-fib RVR.  Checking urine metanephrine and urine catecholamine level. - Continue cardiac monitoring. - Obtaining echocardiogram. -Please follow-up with cardiology in the morning for further recommendation.  Essential hypertension -Reported she takes Toprol-XL and valsartan in the morning and Aldactone and minoxidil at nighttime.  However this medication is not matching up with the cardiologist last note from May 2023.  Need to verify with pharmacy before resumption of home medications.  Currently on Cardizem drip for management of A-fib RVR.  Blood pressure is borderline soft. -Continue cardiac monitoring  Chest pain- 2nd to A fib RVR -Flat troponin.  EKG showing A-fib RVR. - Chest pain likely secondary to in the setting of A-fib RVR. - Continue to monitor troponin level and obtain echocardiogram. -Continue cardiac monitoring  History of CAD -Patient has history of nonobstructive CAD.  Continue aspirin, statin.  Holding Toprol-XL as patient is on Cardizem drip.   History of hyperaldosteronism Left adrenal tumor status post adrenalectomy in 2013 Right adrenal adenoma -Patient used to follow nephrologist while in [place] back in 2013.  However patient does not have any nephrologist or endocrine care over here at [facility]  Blood pressure regimen has been managing by cardiologist. Per chart review patient has been evaluated by radiologist at [facility] 02/14/2023.  CT abdomen pelvis showed . Stable 19 mm right adrenal adenoma, considered benign requiring no independent imaging follow-up. No definite left adrenal gland identified. There has a small focus of soft tissue near the expected location of the left adrenal gland measuring 10 x 4 mm which may be a small lymph node or atretic adrenal gland.  -Currently checking 24-hour urine metanephrine and catecholamine to determine if any  adrenal crisis driving A-fib RVR. -Patient needs to establish care with outpatient endocrine and nephrology upon discharge.  Hyperlipidemia -Continue rosuvastatin 40 mg daily  Generalized anxiety disorder -Continue BuSpar 5 mg daily and Lexapro 20 mg daily.  DVT prophylaxis:  IV heparin gtts Code Status:  Full Code Diet: Heart healthy diet Family Communication: Patient's mother present at bedside, at the time of interview. Opportunity was given to ask question and all questions were  answered satisfactorily.  Disposition Plan: Pending improvement of A-fib.  Waiting for cardiology formal evaluation. Consults: Cardiology Admission status:   Inpatient, cardiac-Telemetry bed  Severity of Illness: The appropriate patient status for this patient is INPATIENT. Inpatient status is judged to be reasonable and necessary in order to provide the required intensity of service to ensure the patient's safety. The patient's presenting symptoms, physical exam findings, and initial radiographic and laboratory data in the context of their chronic comorbidities is felt to place them at high risk for further clinical deterioration. Furthermore, it is not anticipated that the patient will be medically stable for discharge from the hospital within 2 midnights of admission.   * I certify that at the point of admission it is my clinical judgment that the patient will require inpatient hospital care spanning beyond 2 midnights from the point of admission due to high intensity of service, high risk for further deterioration and high frequency of surveillance required.Marland Kitchen    Tereasa Coop, MD [facility]  How to contact the [facility] Attending or Consulting provider 7A - 7P or covering provider during after hours 7P -7A, for this patient.  Check the care team in [facility] and look for a) attending/consulting [facility] provider listed and b) the [facility] team listed Log into [email or web address] and use [place]'s universal password to  access. If you do not have the password, please contact the hospital operator. Locate the [facility] provider you are looking for under [facility] and page to a number that you can be directly reached. If you still have difficulty reaching the provider, please page the [facility] (Director on Call) for the Hospitalists listed on amion for assistance.  04/18/2023, 6:56 AM

## 2023-04-18 NOTE — Progress Notes (Signed)
Mobility Specialist Progress Note:   04/18/23 0900  Mobility  Activity Ambulated independently in hallway  Level of Assistance Modified independent, requires aide device or extra time  Assistive Device None  Distance Ambulated (ft) 270 ft  Activity Response Tolerated well  Mobility Referral Yes  $Mobility charge 1 Mobility  Mobility Specialist Start Time (ACUTE ONLY) 0933  Mobility Specialist Stop Time (ACUTE ONLY) 0945  Mobility Specialist Time Calculation (min) (ACUTE ONLY) 12 min    Pre Mobility: 80 HR,  98% SpO2 During Mobility: 112 HR,  97% SpO2 Post Mobility:  93 HR,  99% SpO2  Pt received in bed, agreeable to mobility. Pt took x1 standing rest break d/t some dizziness. Stated she is used to it from her vertigo. Otherwise asymptomatic w/ no complaints. Pt left in bed with call bell and all needs met. Family present.  [place]ante Earlene Plater Mobility Specialist Please contact via Special educational needs teacher or Rehab office at [phone]

## 2023-04-18 NOTE — Plan of Care (Signed)

## 2023-04-18 NOTE — Consult Note (Signed)
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 Lynne Logan.  History of Present Illness:   Tracy Decker previously followed with Dr. Cristal Deer for atypical chest pain and HTN management. Limited echo 06/2020 showed EF 70-75%, G1DD, severe LVH, elevated LAP, small pericardial effusion. Cor CT showed minimal nonobstructive CAD, CAC of 3 (83%ile), incidental small PFO not requiring further intervention, and trivial aortic atherosclerosis. This was done in the setting of mild, largely flat troponin elevation (peak 153). CRP/ESR were normal. At last OV 10/2021 her dentist had shared concern regarding gum hyperplasia with amlodipine. Lisinopril was changed to valsartan 320mg  to allow room to come off amlodipine with a plan that if systolic was <130 to reduce to 5mg  and if <110 to stop altogether. She otherwise was continued on hydrochlorothiazide, metoprolol, minoxidil, and spironolactone. 6 week follow-up was planned but did yet return. She has since seen her PCP who reduced rosuvastatin to 5mg  daily due to hip pain with improvement. She also confirms she remains off amlodipine altogether. 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 She does not drink or use drugs. She drinks infrequent ETOH.  She in her USOH yesterday and  attended a concert and felt fine. She was awake watching TV late during the night and developed sensation of palpitations in her chest like a baby was kicking with some chest discomfort. She then began to develop sensation of neck pain/shoulder blade pain similar to 06/2020 so EMS was called. She was found to be in AF RVR. Labs showed hsTroponin 17->36, TSH wnl, hCG neg, CXR NAD. She was started on heparin drip and diltiazem drip with improvement in HR to the 80s-90s. No further chest/shoulder discomfort, just can sense some irregularity of her heart rate. She otherwise has not had any recent anginal symptoms outside this event. Repeat echo pending. Primary team has ordered urine metanephrines/catecholamines.  Past Medical History:  Diagnosis Date   Aortic atherosclerosis (HCC)    Hypertension    Left ventricular hypertrophy    Mild CAD    Obesity    Pericardial effusion    Renal disorder     Past Surgical History:  Procedure Laterality Date   ADRENAL GLAND SURGERY Bilateral      Home Medications:  Prior to Admission medications   Medication Sig Start Date End Date Taking? Authorizing Provider  aspirin EC 81 MG EC tablet Take 1 tablet (81 mg total) by mouth daily. Swallow whole. 07/18/20  Yes Regalado, Belkys A, MD  buPROPion (WELLBUTRIN) 100 MG tablet Take 100 mg by mouth daily.   Yes [provider]  busPIRone (BUSPAR) 5 MG tablet Take 5 mg by mouth daily as needed (for anxiety). 07/06/22  Yes [provider]  Cholecalciferol (VITAMIN D3) 50 MCG (2000 UT) CAPS Take 2,000 Units by mouth daily.  Yes [provider]  escitalopram (LEXAPRO) 20 MG tablet Take 20 mg by mouth daily.   Yes [provider]  Fluocinolone Acetonide Scalp 0.01 % OIL Apply 1 application topically daily as needed for irritation. 02/01/20  Yes [provider]  hydrochlorothiazide (MICROZIDE) 12.5 MG capsule Take 12.5 mg by mouth daily. 06/16/20  Yes [provider]   metoprolol succinate (TOPROL-XL) 50 MG 24 hr tablet Take 50 mg by mouth daily. 07/28/20  Yes [provider]  metroNIDAZOLE (METROGEL) 0.75 % vaginal gel Place 1 Applicatorful vaginally at bedtime. Apply one applicatorful to vagina at bedtime for 10 days, then twice a week for 6 months. Patient taking differently: Place 1 Applicatorful vaginally at bedtime as needed (for irritation). 05/26/22  Yes Harraway-Smith, Eber Jones, MD  minoxidil (LONITEN) 2.5 MG tablet Take 2.5 mg by mouth daily.   Yes [provider]  rosuvastatin (CRESTOR) 5 MG tablet Take 5 mg by mouth daily.   Yes [provider]  spironolactone (ALDACTONE) 50 MG tablet Take 50 mg by mouth daily.   Yes [provider]  valsartan (DIOVAN) 320 MG tablet Take 1 tablet (320 mg total) by mouth daily. 11/03/21  Yes Jodelle Red, MD  zolpidem (AMBIEN) 10 MG tablet Take 10 mg by mouth at bedtime as needed for sleep. 07/09/20  Yes [provider]  pantoprazole (PROTONIX) 40 MG tablet Take 1 tablet (40 mg total) by mouth daily. Patient not taking: Reported on 04/18/2023 07/18/20   Regalado, Jon Billings A, MD  rosuvastatin (CRESTOR) 40 MG tablet Take 1 tablet (40 mg total) by mouth daily. Patient not taking: Reported on 04/18/2023 07/18/20   Alba Cory, MD    Inpatient Medications: Scheduled Meds:  aspirin EC  81 mg Oral Daily   busPIRone  5 mg Oral Daily   escitalopram  20 mg Oral Daily   metoprolol succinate  50 mg Oral Daily   pantoprazole  40 mg Oral Daily   rosuvastatin  40 mg Oral Daily   sodium chloride flush  3 mL Intravenous Q12H   Continuous Infusions:  sodium chloride     diltiazem (CARDIZEM) infusion 5 mg/hr (04/18/23 0224)   heparin 1,100 Units/hr (04/18/23 0834)   PRN Meds: sodium chloride, acetaminophen **OR** acetaminophen, senna-docusate, sodium chloride flush  Allergies:    Allergies  Allergen Reactions   Ibuprofen Other (See Comments)    Causes increase in BP     Social History:   Social History   Socioeconomic History   Marital status: Single    Spouse name: Not on file   Number of children: Not on file   Years of education: Not on file   Highest education level: Not on file  Occupational History   Not on file  Tobacco Use   Smoking status: Never   Smokeless tobacco: Never  Vaping Use   Vaping status: Never Used  Substance and Sexual Activity   Alcohol use: Not Currently   Drug use: Never   Sexual activity: Not Currently    Birth control/protection: None  Other Topics Concern   Not on file  Social History Narrative   Not on file   Social Determinants of Health   Financial Resource Strain: Not on file  Food Insecurity: No Food Insecurity (04/18/2023)   Hunger Vital Sign    Worried About Running Out of Food in the Last Year: Never true    Ran Out of Food in the Last Year: Never true  Transportation Needs: No Transportation Needs (  04/18/2023)   PRAPARE - Administrator, Civil Service (Medical): No    Lack of Transportation (Non-Medical): No  Physical Activity: Not on file  Stress: Not on file  Social Connections: Not on file  Intimate Partner Violence: Not At Risk (04/18/2023)   Humiliation, Afraid, Rape, and Kick questionnaire    Fear of Current or Ex-Partner: No    Emotionally Abused: No    Physically Abused: No    Sexually Abused: No    Family History:   Family History  Problem Relation Age of Onset   Breast cancer Maternal Grandmother    Heart disease Paternal Grandmother    Heart disease Paternal Grandfather      ROS:  Please see the history of present illness.   All other ROS reviewed and negative.     Physical Exam/Data:   Vitals:   04/18/23 0400 04/18/23 0430 04/18/23 0550 04/18/23 0827  BP: 114/77 120/84 133/77 125/88  Pulse: 99 (!) 105 95 76  Resp: 19 17 10 16   Temp:   98.6 F (37 C) 98.7 F (37.1 C)  TempSrc:   Oral Oral  SpO2: 99% 97% 95% 99%  Weight:   84.3 kg   Height:        No intake or output data in the 24 hours ending 04/18/23 1201    04/18/2023    5:50 AM 04/18/2023    2:06 AM 01/13/2022    1:06 PM  Last 3 Weights  Weight (lbs) 185 lb 13.6 oz 180 lb 170 lb  Weight (kg) 84.3 kg 81.647 kg 77.111 kg     Body mass index is 31.9 kg/m.  General: Well developed, well nourished, in no acute distress. Head: Normocephalic, atraumatic, sclera non-icteric, no xanthomas, nares are without discharge. Neck: Negative for carotid bruits. JVP not elevated. Lungs: Clear bilaterally to auscultation without wheezes, rales, or rhonchi. Breathing is unlabored. Heart: RRR S1 S2 without murmurs, rubs, or gallops.  Abdomen: Soft, non-tender, non-distended with normoactive bowel sounds. No rebound/guarding. Extremities: No clubbing or cyanosis. No edema. Distal pedal pulses are 2+ and equal bilaterally. Neuro: Alert and oriented X 3. Moves all extremities spontaneously. Psych:  Responds to questions appropriately with a normal affect.   EKG:  The EKG was personally reviewed and demonstrates: atrial fibrillation 155bpm suspected LVH with nonspecific STTW changes  Telemetry:  Telemetry was personally reviewed and demonstrates:  AFib  Relevant CV Studies: Cor CT 06/2020  HISTORY: Chest pain/anginal equiv, ECGs or troponins abnormal   EXAM: Cardiac/Coronary CT   TECHNIQUE: The patient was scanned on a [employer].   PROTOCOL: A 120 kV prospective scan was triggered in the descending thoracic aorta at 111 HU's. Axial non-contrast 3 mm slices were carried out through the heart. The data set was analyzed on a dedicated work station and scored using the Agatson method. Gantry rotation speed was 250 msecs and collimation was 0.6 mm. Beta blockade and 0.8 mg of sl NTG was given. The 3D data set was reconstructed in 5% intervals of 35-75% of the R-R cycle. Diastolic phases were analyzed on a dedicated work station using MPR, MIP and VRT modes. The patient  received 80mL OMNIPAQUE IOHEXOL 350 MG/ML SOLN of contrast.   FINDINGS: Coronary calcium score: The patient's coronary artery calcium score is 3, which places the patient in the 83rd percentile.   Coronary arteries: Normal coronary origins.  Right dominance.   Right Coronary Artery: Normal caliber vessel, gives rise to PDA. No significant plaque  or stenosis.   Left Main Coronary Artery: Normal caliber vessel. No significant plaque or stenosis.   Left Anterior Descending Coronary Artery: Normal caliber vessel. Very small area of noncalcified plaque at the takeoff of D1, 1-24% stenosis. Gives rise to 2 diagonal branches without significant plaque or stenosis. Distal LAD wraps apex.   Left Circumflex Artery: Normal caliber vessel. No significant plaque or stenosis. Gives rise to 2 large OM branches without significant plaque or stenosis.   Aorta: Normal size, mm at the mid ascending aorta (level of the PA bifurcation) measured double oblique. Trivial calcifications, consistent with aortic atherosclerosis. No dissection.   Aortic Valve: No calcifications. Trileaflet.   Other findings:   Normal pulmonary vein drainage into the left atrium.   Normal left atrial appendage without a thrombus.   Normal size of the pulmonary artery.   Likely small PFO, incidental finding.   IMPRESSION: 1.  Minimal nonobstructive CAD, CADRADS = 1.   2. Coronary calcium score of 3. This was 83rd percentile for age and sex matched control.   3. Normal coronary origin with right dominance.   4.  Incidental very small PFO.   5.  Trivial aortic atherosclerosis.     Electronically Signed   By: Jodelle Red M.D.   On: 07/17/2020 08:07  2D echo 06/2020    1. Left ventricular ejection fraction, by estimation, is 70 to 75%. The  left ventricle has hyperdynamic function. The left ventricle has no  regional wall motion abnormalities. There is severe left ventricular  hypertrophy. Left  ventricular diastolic  parameters are consistent with Grade I diastolic dysfunction (impaired  relaxation). Elevated left atrial pressure.   2. Right ventricular systolic function is normal. The right ventricular  size is normal.   3. A small pericardial effusion is present.   4. The mitral valve is normal in structure. Trivial mitral valve  regurgitation. No evidence of mitral stenosis.   5. The aortic valve is tricuspid. Aortic valve regurgitation is not  visualized. No aortic stenosis is present.   6. The inferior vena cava is normal in size with greater than 50%  respiratory variability, suggesting right atrial pressure of 3 mmHg.      Laboratory Data:  High Sensitivity Troponin:   Recent Labs  Lab 04/18/23 0215 04/18/23 0452  TROPONINIHS 17 36*     Chemistry Recent Labs  Lab 04/18/23 0215 04/18/23 0452  NA 137 138  K 4.2 4.2  CL 105 108  CO2 20* 22  GLUCOSE 121* 113*  BUN 16 15  CREATININE 0.93 0.85  CALCIUM 9.4 8.9  GFRNONAA >60 >60  ANIONGAP 12 8    Recent Labs  Lab 04/18/23 0215 04/18/23 0452  PROT 7.3 6.9  ALBUMIN 3.9 3.6  AST 13* 12*  ALT 11 12  ALKPHOS 61 54  BILITOT 0.7 0.6   Lipids No results for input(s): "CHOL", "TRIG", "HDL", "LABVLDL", "LDLCALC", "CHOLHDL" in the last 168 hours.  Hematology Recent Labs  Lab 04/18/23 0215 04/18/23 0452  WBC 9.7 8.6  RBC 4.51 4.44  HGB 14.5 14.1  HCT 43.7 42.1  MCV 96.9 94.8  MCH 32.2 31.8  MCHC 33.2 33.5  RDW 13.0 12.8  PLT 327 333   Thyroid  Recent Labs  Lab 04/18/23 0215  TSH 2.288    BNPNo results for input(s): "BNP", "PROBNP" in the last 168 hours.  DDimer No results for input(s): "DDIMER" in the last 168 hours.   Radiology/Studies:  DG Chest Portable 1 View  Result Date: 04/18/2023 CLINICAL DATA:  Chest pain EXAM: PORTABLE CHEST 1 VIEW COMPARISON:  07/15/2020 FINDINGS: The heart size and mediastinal contours are within normal limits. Both lungs are clear. The visualized skeletal  structures are unremarkable. IMPRESSION: No active disease. Electronically Signed   By: Minerva Fester M.D.   On: 04/18/2023 02:31     Assessment and Plan:   1. New onset atrial fibrillation RVR - CHADSVASC 3 for HTN, female, coronary/aortic atherosclerosis (will get A1C for completeness given hyperglycemia) - currently on heparin drip, diltiazem drip, Toprol 50mg  daily - HR 80s-90s presently - primary team has ordered catecholamines/metanephrines - echo pending - fairly acute onset, though had similar presentation in 2022 when she was in sinus tach -> will discuss with MD - does report snoring so would consider outpatient sleep study  2. Chest pain, mildly elevated troponin, h/o minimal CAD by CT 06/2020 - hstroponin 17->36, will trend to peak but suspect demand process from #1- depending on trends/echo, consider d/c ASA when DOAC started - was on higher dose rosuvastatin in the past, now 5mg  daily due to myalgias - check lipids in AM  3. Essential HTN - home regimen included metoprolol 50mg , hydrochlorothiazide 12.5mg  daily, spironolactone 50mg  daily, minoxidil 2.5mg  daily, valsartan 320mg  daily - will review planned regimen in context above - primary team also ordered f/u urine catecholamines/metanephrines - consider OP sleep study given h/o HTN, AF  4. Severe LVH by prior echo - ?related to h/o HTN - repeat echo pending  5. H/o small pericardial effusion - repeat echo pending  Risk Assessment/Risk Scores:          CHA2DS2-VASc Score = 3   This indicates a 3.2% annual risk of stroke. The patient's score is based upon: CHF History: 0 HTN History: 1 Diabetes History: 0 Stroke History: 0 Vascular Disease History: 1 Age Score: 0 Gender Score: 1      For questions or updates, please contact [facility] Please consult [email or web address] for contact info under    Signed, Laurann Montana, PA-C  04/18/2023 12:01 PM

## 2023-04-18 NOTE — ED Notes (Signed)
ED TO INPATIENT HANDOFF REPORT  ED Nurse Name and Phone #: [phone]  S Name/Age/Gender Tracy Decker 57 y.o. female Room/Bed: 019C/019C  Code Status   Code Status: Full Code  Home/SNF/Other Home Patient oriented to: self, place, time, and situation Is this baseline? Yes   Triage Complete: Triage complete  Chief Complaint Atrial fibrillation with RVR (HCC) [I48.91]  Triage Note Pt arrives to ED c/o CP that radiates to left shoulder and jaw. Pt with hx of MI. Pt with A-fib RVR per EMS. Pt with no hx of this. Pt with HR 80-200. Pt given of NS   Allergies Allergies  Allergen Reactions   Ibuprofen Other (See Comments)    Causes increase in BP    Level of Care/Admitting Diagnosis ED Disposition     ED Disposition  Admit   Condition  --   Comment  Hospital Area: [facility] [[id number]]  Level of Care: Telemetry Cardiac [103]  May admit patient to [facility] or [facility] if equivalent level of care is available:: No  Covid Evaluation: Asymptomatic - no recent exposure (last 10 days) testing not required  Diagnosis: Atrial fibrillation with RVR [facility]) [[id number]]  Admitting Physician: Tereasa Coop [[id number]]  Attending Physician: Tereasa Coop [[id number]]  Certification:: I certify this patient will need inpatient services for at least 2 midnights  Expected Medical Readiness: 04/21/2023          B Medical/Surgery History Past Medical History:  Diagnosis Date   Hypertension    Renal disorder    Past Surgical History:  Procedure Laterality Date   ADRENAL GLAND SURGERY Bilateral      A IV Location/Drains/Wounds Patient Lines/Drains/Airways Status     Active Line/Drains/Airways     Name Placement date Placement time Site Days   Peripheral IV 07/15/20 Left Arm 07/15/20  0012  Arm  1007   Peripheral IV 07/15/20 Right Antecubital 07/15/20  0221  Antecubital  1007   Peripheral IV 04/18/23 20 G 1" Anterior;Proximal;Right Forearm 04/18/23   0205  Forearm  less than 1            Intake/Output Last 24 hours No intake or output data in the 24 hours ending 04/18/23 0425  Labs/Imaging Results for orders placed or performed during the hospital encounter of 04/18/23 (from the past 48 hour(s))  CBC     Status: None   Collection Time: 04/18/23  2:15 AM  Result Value Ref Range   WBC 9.7 4.0 - 10.5 K/uL   RBC 4.51 3.87 - 5.11 MIL/uL   Hemoglobin 14.5 12.0 - 15.0 g/dL   HCT 02.7 25.3 - 66.4 %   MCV 96.9 80.0 - 100.0 fL   MCH 32.2 26.0 - 34.0 pg   MCHC 33.2 30.0 - 36.0 g/dL   RDW 40.3 47.4 - 25.9 %   Platelets 327 150 - 400 K/uL   nRBC 0.0 0.0 - 0.2 %    Comment: Performed at [facility], [address], [place], [place] [address]  Comprehensive metabolic panel     Status: Abnormal   Collection Time: 04/18/23  2:15 AM  Result Value Ref Range   Sodium 137 135 - 145 mmol/L   Potassium 4.2 3.5 - 5.1 mmol/L   Chloride 105 98 - 111 mmol/L   CO2 20 (L) 22 - 32 mmol/L   Glucose, Bld 121 (H) 70 - 99 mg/dL    Comment: Glucose reference range applies only to samples taken after fasting for at  least 8 hours.   BUN 16 6 - 20 mg/dL   Creatinine, Ser 4.09 0.44 - 1.00 mg/dL   Calcium 9.4 8.9 - 81.1 mg/dL   Total Protein 7.3 6.5 - 8.1 g/dL   Albumin 3.9 3.5 - 5.0 g/dL   AST 13 (L) 15 - 41 U/L   ALT 11 0 - 44 U/L   Alkaline Phosphatase 61 38 - 126 U/L   Total Bilirubin 0.7 0.3 - 1.2 mg/dL   GFR, Estimated >91 >47 mL/min    Comment: (NOTE) Calculated using the CKD-EPI Creatinine Equation (2021)    Anion gap 12 5 - 15    Comment: Performed at [facility], [address] [place], [place] [address]  Protime-INR     Status: None   Collection Time: 04/18/23  2:15 AM  Result Value Ref Range   Prothrombin Time 11.5 11.4 - 15.2 seconds   INR 0.8 0.8 - 1.2    Comment: (NOTE) INR goal varies based on device and disease states. Performed at [facility], [address], [place], [place] [address]   hCG, serum,  qualitative     Status: None   Collection Time: 04/18/23  2:15 AM  Result Value Ref Range   Preg, Serum NEGATIVE NEGATIVE    Comment:        THE SENSITIVITY OF THIS METHODOLOGY IS >10 mIU/mL. Performed at [facility], [address], [place], [place] [address]   TSH     Status: None   Collection Time: 04/18/23  2:15 AM  Result Value Ref Range   TSH 2.288 0.350 - 4.500 uIU/mL    Comment: Performed by a 3rd Generation assay with a functional sensitivity of <=0.01 uIU/mL. Performed at [facility], [address], [place], [place] [address]   Troponin I (High Sensitivity)     Status: None   Collection Time: 04/18/23  2:15 AM  Result Value Ref Range   Troponin I (High Sensitivity) 17 <18 ng/L    Comment: (NOTE) Elevated high sensitivity troponin I (hsTnI) values and significant  changes across serial measurements may suggest ACS but many other  chronic and acute conditions are known to elevate hsTnI results.  Refer to the "Links" section for chest pain algorithms and additional  guidance. Performed at [facility], [address], [place], [place] [address]    DG Chest Portable 1 View  Result Date: 04/18/2023 CLINICAL DATA:  Chest pain EXAM: PORTABLE CHEST 1 VIEW COMPARISON:  07/15/2020 FINDINGS: The heart size and mediastinal contours are within normal limits. Both lungs are clear. The visualized skeletal structures are unremarkable. IMPRESSION: No active disease. Electronically Signed   By: Minerva Fester M.D.   On: 04/18/2023 02:31    Pending Labs Unresulted Labs (From admission, onward)     Start     Ordered   04/18/23 0500  Comprehensive metabolic panel  Tomorrow morning,   R        04/18/23 0400   04/18/23 0500  CBC  Tomorrow morning,   R        04/18/23 0400   04/18/23 0401  HIV Antibody (routine testing w rflx)  (HIV Antibody (Routine testing w reflex) panel)  Once,   R        04/18/23 0400   04/18/23 0401  Metanephrines, urine, 24 hour  Once,   R         04/18/23 0400   04/18/23 0401  Catecholamines,Ur.,Free,24  Hh  Once,   R        04/18/23 0400            Vitals/Pain Today's Vitals   04/18/23 0245 04/18/23 0300 04/18/23 0315 04/18/23 0400  BP:  (!) 145/85  114/77  Pulse: 93 (!) 101 92 99  Resp: (!) 24 (!) 21 18 19   Temp:      TempSrc:      SpO2: 99% 99% 99% 99%  Weight:      Height:      PainSc:        Isolation Precautions No active isolations  Medications Medications  diltiazem (CARDIZEM) 1 mg/mL load via infusion 10 mg (10 mg Intravenous Bolus from Bag 04/18/23 0222)    And  diltiazem (CARDIZEM) 125 mg in dextrose 5% 125 mL (1 mg/mL) infusion (5 mg/hr Intravenous New Bag/Given 04/18/23 0224)  aspirin EC tablet 81 mg (has no administration in time range)  rosuvastatin (CRESTOR) tablet 40 mg (has no administration in time range)  pantoprazole (PROTONIX) EC tablet 40 mg (has no administration in time range)  enoxaparin (LOVENOX) injection 40 mg (has no administration in time range)  sodium chloride flush (NS) 0.9 % injection 3 mL (has no administration in time range)  sodium chloride flush (NS) 0.9 % injection 3 mL (has no administration in time range)  0.9 %  sodium chloride infusion (has no administration in time range)  acetaminophen (TYLENOL) tablet 650 mg (has no administration in time range)    Or  acetaminophen (TYLENOL) suppository 650 mg (has no administration in time range)  senna-docusate (Senokot-S) tablet 1 tablet (has no administration in time range)    Mobility walks     Focused Assessments Cardiac Assessment Handoff:  Cardiac Rhythm: Atrial fibrillation No results found for: "CKTOTAL", "CKMB", "CKMBINDEX", "TROPONINI" Lab Results  Component Value Date   DDIMER <0.27 07/15/2020   Does the Patient currently have chest pain? No    R Recommendations: See Admitting Provider Note  Report given to:   Additional Notes: Pt a/o x 4 able to ambulate with no difficulty

## 2023-04-18 NOTE — Progress Notes (Signed)
Patient in NSR, converted at 1649.  Will complete EKG to verify.

## 2023-04-18 NOTE — Progress Notes (Signed)
PHARMACY - ANTICOAGULATION CONSULT NOTE  Pharmacy Consult for heparin Indication: atrial fibrillation  Allergies  Allergen Reactions   Ibuprofen Other (See Comments)    Causes increase in BP    Patient Measurements: Height: 5\' 4"  (162.6 cm) Weight: 81.6 kg (180 lb) IBW/kg (Calculated) : 54.7 Heparin Dosing Weight: 70kg  Vital Signs: Temp: 98.6 F (37 C) (10/28 0209) Temp Source: Oral (10/28 0209) BP: 120/84 (10/28 0430) Pulse Rate: 105 (10/28 0430)  Labs: Recent Labs    04/18/23 0215 04/18/23 0452  HGB 14.5 14.1  HCT 43.7 42.1  PLT 327 333  LABPROT 11.5  --   INR 0.8  --   CREATININE 0.93  --   TROPONINIHS 17  --     Estimated Creatinine Clearance: 69.8 mL/min (by C-G formula based on SCr of 0.93 mg/dL).   Medical History: Past Medical History:  Diagnosis Date   Hypertension    Renal disorder     Medications:  Medications Prior to Admission  Medication Sig Dispense Refill Last Dose   aspirin EC 81 MG EC tablet Take 1 tablet (81 mg total) by mouth daily. Swallow whole. 30 tablet 11 04/18/2023 at 0100- 4 tab   buPROPion (WELLBUTRIN) 100 MG tablet Take 100 mg by mouth daily.   04/17/2023   busPIRone (BUSPAR) 5 MG tablet Take 5 mg by mouth daily as needed (for anxiety).   Past Week   Cholecalciferol (VITAMIN D3) 50 MCG (2000 UT) CAPS Take 2,000 Units by mouth daily.   04/17/2023   escitalopram (LEXAPRO) 20 MG tablet Take 20 mg by mouth daily.   04/17/2023   Fluocinolone Acetonide Scalp 0.01 % OIL Apply 1 application topically daily as needed for irritation.   Past Week   hydrochlorothiazide (MICROZIDE) 12.5 MG capsule Take 12.5 mg by mouth daily.   04/17/2023   metoprolol succinate (TOPROL-XL) 50 MG 24 hr tablet Take 50 mg by mouth daily.   04/17/2023 at 1030   metroNIDAZOLE (METROGEL) 0.75 % vaginal gel Place 1 Applicatorful vaginally at bedtime. Apply one applicatorful to vagina at bedtime for 10 days, then twice a week for 6 months. (Patient taking  differently: Place 1 Applicatorful vaginally at bedtime as needed (for irritation).) 70 g 5 Past Week   minoxidil (LONITEN) 2.5 MG tablet Take 2.5 mg by mouth daily.   04/17/2023   rosuvastatin (CRESTOR) 5 MG tablet Take 5 mg by mouth daily.   04/17/2023   spironolactone (ALDACTONE) 50 MG tablet Take 50 mg by mouth daily.   04/17/2023 at pm   valsartan (DIOVAN) 320 MG tablet Take 1 tablet (320 mg total) by mouth daily. 90 tablet 3 04/17/2023 at am   zolpidem (AMBIEN) 10 MG tablet Take 10 mg by mouth at bedtime as needed for sleep.   Past Week   pantoprazole (PROTONIX) 40 MG tablet Take 1 tablet (40 mg total) by mouth daily. (Patient not taking: Reported on 04/18/2023) 30 tablet 0 Not Taking   rosuvastatin (CRESTOR) 40 MG tablet Take 1 tablet (40 mg total) by mouth daily. (Patient not taking: Reported on 04/18/2023) 30 tablet 0 Not Taking   Scheduled:   aspirin EC  81 mg Oral Daily   busPIRone  5 mg Oral Daily   escitalopram  20 mg Oral Daily   pantoprazole  40 mg Oral Daily   rosuvastatin  40 mg Oral Daily   sodium chloride flush  3 mL Intravenous Q12H   Infusions:   sodium chloride     diltiazem (CARDIZEM) infusion  5 mg/hr (04/18/23 0224)    Assessment: 57yo female c/o CP that radiates to left shoulder and jaw, troponin negative but found to be in Afib w/ RVR >> to begin heparin.  Goal of Therapy:  Heparin level 0.3-0.7 units/ml Monitor platelets by anticoagulation protocol: Yes   Plan:  Heparin 4000 units IV bolus x1 followed by infusion at 1100 units/hr. Monitor heparin levels and CBC.  Vernard Gambles, PharmD, BCPS  04/18/2023,5:55 AM

## 2023-04-18 NOTE — ED Provider Notes (Signed)
[place] EMERGENCY DEPARTMENT AT [facility] Provider Note  CSN: [id number] Arrival date & time: 04/18/23 0203  Chief Complaint(s) Chest Pain  HPI Tracy Decker is a 57 y.o. female with PMH HTN, adrenal hyperplasia with right-sided adenoma, left adrenalectomy previous hospital admission for NSTEMI but clean CT coronary who presents emergency room for evaluation of chest pain and palpitations.  States symptoms began this evening with a sensation of something "kicking in her chest".  Found to be in A-fib with RVR by EMS.  Received 100 cc of normal saline prior to arrival by EMS.  No additional meds given.  Endorses mild shortness of breath but denies abdominal pain, nausea, vomiting, headache, fever, numbness, tingling, weakness or other systemic complaints.   Past Medical History Past Medical History:  Diagnosis Date   Hypertension    Renal disorder    Patient Active Problem List   Diagnosis Date Noted   Chest pain 07/15/2020   Sinus tachycardia 07/15/2020   History of COVID-19 07/15/2020   Metabolic acidosis 07/15/2020   Essential hypertension 07/15/2020   Closed displaced fracture of distal phalanx of lesser toe of left foot 11/05/2019   Home Medication(s) Prior to Admission medications   Medication Sig Start Date End Date Taking? Authorizing Provider  amLODipine (NORVASC) 10 MG tablet Take 10 mg by mouth daily.    [provider]  aspirin EC 81 MG EC tablet Take 1 tablet (81 mg total) by mouth daily. Swallow whole. 07/18/20   Regalado, Belkys A, MD  buPROPion (WELLBUTRIN) 100 MG tablet Take by mouth.    [provider]  Cholecalciferol (VITAMIN D3) 50 MCG (2000 UT) CAPS Take 2,000 Units by mouth daily.    [provider]  doxycycline (VIBRAMYCIN) 100 MG capsule Take 1 capsule (100 mg total) by mouth 2 (two) times daily. 01/01/22   Willodean Rosenthal, MD  escitalopram (LEXAPRO) 20 MG tablet Take by mouth.    [provider]   Fluocinolone Acetonide Scalp 0.01 % OIL Apply 1 application topically daily as needed for irritation. 02/01/20   [provider]  hydrochlorothiazide (MICROZIDE) 12.5 MG capsule Take 12.5 mg by mouth daily. 06/16/20   [provider]  metoprolol succinate (TOPROL-XL) 50 MG 24 hr tablet Take by mouth. 07/28/20   [provider]  metroNIDAZOLE (METROGEL) 0.75 % vaginal gel Place 1 Applicatorful vaginally at bedtime. Apply one applicatorful to vagina at bedtime for 10 days, then twice a week for 6 months. 05/26/22   Willodean Rosenthal, MD  minoxidil (LONITEN) 2.5 MG tablet Take 5 mg by mouth daily.    [provider]  norethindrone (AYGESTIN) 5 MG tablet Take 1 tablet (5 mg total) by mouth daily. 12/30/21   Willodean Rosenthal, MD  OZEMPIC, 1 MG/DOSE, 4 MG/3ML SOPN Inject 1 mg into the skin once a week. 08/21/21   [provider]  pantoprazole (PROTONIX) 40 MG tablet Take 1 tablet (40 mg total) by mouth daily. 07/18/20   Regalado, Belkys A, MD  rosuvastatin (CRESTOR) 40 MG tablet Take 1 tablet (40 mg total) by mouth daily. 07/18/20   Regalado, Belkys A, MD  spironolactone (ALDACTONE) 50 MG tablet Take 50 mg by mouth daily.    [provider]  SUMAtriptan (IMITREX) 50 MG tablet Take by mouth.    [provider]  traMADol (ULTRAM) 50 MG tablet Take 1 tablet (50 mg total) by mouth every 6 (six) hours as needed for severe pain. 04/27/21   Willodean Rosenthal, MD  valsartan (DIOVAN)  320 MG tablet Take 1 tablet (320 mg total) by mouth daily. 11/03/21   Jodelle Red, MD  zolpidem (AMBIEN) 10 MG tablet Take 10 mg by mouth at bedtime as needed for sleep. 07/09/20   [provider]                                                                                                                                    Past Surgical History Past Surgical History:  Procedure Laterality Date   ADRENAL GLAND SURGERY Bilateral    Family  History Family History  Problem Relation Age of Onset   Breast cancer Maternal Grandmother     Social History Social History   Tobacco Use   Smoking status: Never   Smokeless tobacco: Never  Vaping Use   Vaping status: Never Used  Substance Use Topics   Alcohol use: Not Currently   Drug use: Never   Allergies Ibuprofen  Review of Systems Review of Systems  Respiratory:  Positive for shortness of breath.   Cardiovascular:  Positive for chest pain and palpitations.    Physical Exam Vital Signs  I have reviewed the triage vital signs BP (!) 145/85   Pulse 92   Temp 98.6 F (37 C) (Oral)   Resp 18   Ht 5\' 4"  (1.626 m)   Wt 81.6 kg   SpO2 99%   BMI 30.90 kg/m   Physical Exam Vitals and nursing note reviewed.  Constitutional:      General: She is not in acute distress.    Appearance: She is well-developed.  HENT:     Head: Normocephalic and atraumatic.  Eyes:     Conjunctiva/sclera: Conjunctivae normal.  Cardiovascular:     Rate and Rhythm: Tachycardia present. Rhythm irregular.     Heart sounds: Murmur heard.  Pulmonary:     Effort: Pulmonary effort is normal. No respiratory distress.     Breath sounds: Normal breath sounds.  Abdominal:     Palpations: Abdomen is soft.     Tenderness: There is no abdominal tenderness.  Musculoskeletal:        General: No swelling.     Cervical back: Neck supple.  Skin:    General: Skin is warm and dry.     Capillary Refill: Capillary refill takes less than 2 seconds.  Neurological:     Mental Status: She is alert.  Psychiatric:        Mood and Affect: Mood normal.     ED Results and Treatments Labs (all labs ordered are listed, but only abnormal results are displayed) Labs Reviewed  COMPREHENSIVE METABOLIC PANEL - Abnormal; Notable for the following components:      Result Value   CO2 20 (*)    Glucose, Bld 121 (*)    AST 13 (*)    All other components within normal limits  CBC  PROTIME-INR  HCG, SERUM,  QUALITATIVE  TSH  TROPONIN  I (HIGH SENSITIVITY)                                                                                                                          Radiology DG Chest Portable 1 View  Result Date: 04/18/2023 CLINICAL DATA:  Chest pain EXAM: PORTABLE CHEST 1 VIEW COMPARISON:  07/15/2020 FINDINGS: The heart size and mediastinal contours are within normal limits. Both lungs are clear. The visualized skeletal structures are unremarkable. IMPRESSION: No active disease. Electronically Signed   By: Minerva Fester M.D.   On: 04/18/2023 02:31    Pertinent labs & imaging results that were available during my care of the patient were reviewed by me and considered in my medical decision making (see MDM for details).  Medications Ordered in ED Medications  diltiazem (CARDIZEM) 1 mg/mL load via infusion 10 mg (10 mg Intravenous Bolus from Bag 04/18/23 0222)    And  diltiazem (CARDIZEM) 125 mg in dextrose 5% 125 mL (1 mg/mL) infusion (5 mg/hr Intravenous New Bag/Given 04/18/23 0224)                                                                                                                                     Procedures .Critical Care  Performed by: Glendora Score, MD Authorized by: Glendora Score, MD   Critical care provider statement:    Critical care time (minutes):  30   Critical care was necessary to treat or prevent imminent or life-threatening deterioration of the following conditions:  Cardiac failure   Critical care was time spent personally by me on the following activities:  Development of treatment plan with patient or surrogate, discussions with consultants, evaluation of patient's response to treatment, examination of patient, ordering and review of laboratory studies, ordering and review of radiographic studies, ordering and performing treatments and interventions, pulse oximetry, re-evaluation of patient's condition and review of old charts   (including  critical care time)  Medical Decision Making / ED Course   This patient presents to the ED for concern of chest pain, palpitations, this involves an extensive number of treatment options, and is a complaint that carries with it a high risk of complications and morbidity.  The differential diagnosis includes new onset A-fib, a flutter, electrolyte abnormality, dehydration, hyperaldosteronism, hypothyroidism, ACS, PE  MDM: Patient seen emergency room for evaluation of chest pain and palpitations.  Physical exam with an irregular tachycardia  but is otherwise unremarkable.  Laboratory evaluation unremarkable.  Pregnancy negative.  TSH normal.  ECG with A-fib with RVR.  Chest x-ray unremarkable.  Patient started on diltiazem infusion with successful rate control but not rhythm control.  Spoke with Dr. Orson Aloe of cardiology who is happy to assist inpatient team if needed.  Patient require hospital admission for new onset A-fib.  Suspect persistent hyperaldosteronism may be underlying trigger and can be investigated inpatient.   Additional history obtained: -Additional history obtained from mother -External records from outside source obtained and reviewed including: Chart review including previous notes, labs, imaging, consultation notes   Lab Tests: -I ordered, reviewed, and interpreted labs.   The pertinent results include:   Labs Reviewed  COMPREHENSIVE METABOLIC PANEL - Abnormal; Notable for the following components:      Result Value   CO2 20 (*)    Glucose, Bld 121 (*)    AST 13 (*)    All other components within normal limits  CBC  PROTIME-INR  HCG, SERUM, QUALITATIVE  TSH  TROPONIN I (HIGH SENSITIVITY)      EKG   EKG Interpretation Date/Time:  Monday April 18 2023 02:07:15 EDT Ventricular Rate:  155 PR Interval:    QRS Duration:  82 QT Interval:  287 QTC Calculation: 440 R Axis:   -14  Text Interpretation: Atrial fibrillation Probable LVH with secondary repol abnrm  Confirmed by Yisell Sprunger ([id number]) on 04/18/2023 3:26:10 AM         Imaging Studies ordered: I ordered imaging studies including chest x-ray I independently visualized and interpreted imaging. I agree with the radiologist interpretation   Medicines ordered and prescription drug management: Meds ordered this encounter  Medications   AND Linked Order Group    diltiazem (CARDIZEM) 1 mg/mL load via infusion 10 mg    diltiazem (CARDIZEM) 125 mg in dextrose 5% 125 mL (1 mg/mL) infusion    -I have reviewed the patients home medicines and have made adjustments as needed  Critical interventions Diltiazem infusion, cardiology consultation  Consultations Obtained: I requested consultation with the cardiologist on-call Dr. Orson Aloe,  and discussed lab and imaging findings as well as pertinent plan - they recommend: Inpatient consultation   Cardiac Monitoring: The patient was maintained on a cardiac monitor.  I personally viewed and interpreted the cardiac monitored which showed an underlying rhythm of: A-fib with RVR, rate controlled A-fib  Social Determinants of Health:  Factors impacting patients care include: none   Reevaluation: After the interventions noted above, I reevaluated the patient and found that they have :improved  Co morbidities that complicate the patient evaluation  Past Medical History:  Diagnosis Date   Hypertension    Renal disorder       Dispostion: I considered admission for this patient, and due to his new onset A-fib patient require hospital mission     Final Clinical Impression(s) / ED Diagnoses Final diagnoses:  None     @PCDICTATION @    Glendora Score, MD 04/18/23 [phone]

## 2023-04-18 NOTE — Progress Notes (Signed)
Echocardiogram 2D Echocardiogram has been performed.  Casimiro Needle P Chimene Salo 04/18/2023, 1:50 PM

## 2023-04-18 NOTE — TOC Benefit Eligibility Note (Signed)
Patient Product/process development scientist completed.    The patient is insured through [employer] [facility]. Patient has [employer], may use a copay card, and/or apply for patient assistance if available.    Ran test claim for Eliquis 5 mg and the current 30 day co-pay is $50.00.   This test claim was processed through [facility]- copay amounts may vary at other pharmacies due to pharmacy/plan contracts, or as the patient moves through the different stages of their insurance plan.     Roland Earl, CPHT Pharmacy Technician III Certified Patient Advocate [facility] Patient Advocate Team Direct Number: [phone]  Fax: [phone]

## 2023-04-18 NOTE — Plan of Care (Signed)
  Problem: Education: Goal: Knowledge of General Education information will improve Description: Including pain rating scale, medication(s)/side effects and non-pharmacologic comfort measures Outcome: Progressing   Problem: Health Behavior/Discharge Planning: Goal: Ability to manage health-related needs will improve Outcome: Progressing   Problem: Clinical Measurements: Goal: Ability to maintain clinical measurements within normal limits will improve Outcome: Progressing Goal: Will remain free from infection Outcome: Progressing Goal: Diagnostic test results will improve Outcome: Progressing Goal: Respiratory complications will improve Outcome: Progressing Goal: Cardiovascular complication will be avoided Outcome: Progressing   Problem: Activity: Goal: Risk for activity intolerance will decrease Outcome: Progressing   Problem: Nutrition: Goal: Adequate nutrition will be maintained Outcome: Progressing   Problem: Coping: Goal: Level of anxiety will decrease Outcome: Progressing   Problem: Pain Management: Goal: General experience of comfort will improve Outcome: Progressing   Problem: Safety: Goal: Ability to remain free from injury will improve Outcome: Progressing   Problem: Skin Integrity: Goal: Risk for impaired skin integrity will decrease Outcome: Progressing

## 2023-04-18 NOTE — ED Triage Notes (Signed)
Pt arrives to ED c/o CP that radiates to left shoulder and jaw. Pt with hx of MI. Pt with A-fib RVR per EMS. Pt with no hx of this. Pt with HR 80-200. Pt given of NS

## 2023-04-19 ENCOUNTER — Encounter (HOSPITAL_COMMUNITY): Payer: Self-pay | Admitting: Internal Medicine

## 2023-04-19 ENCOUNTER — Encounter (HOSPITAL_COMMUNITY): Payer: Self-pay | Admitting: Anesthesiology

## 2023-04-19 ENCOUNTER — Other Ambulatory Visit (HOSPITAL_BASED_OUTPATIENT_CLINIC_OR_DEPARTMENT_OTHER): Payer: Self-pay | Admitting: Cardiology

## 2023-04-19 ENCOUNTER — Other Ambulatory Visit (HOSPITAL_COMMUNITY): Payer: No Typology Code available for payment source

## 2023-04-19 DIAGNOSIS — I10 Essential (primary) hypertension: Secondary | ICD-10-CM | POA: Diagnosis not present

## 2023-04-19 DIAGNOSIS — I2510 Atherosclerotic heart disease of native coronary artery without angina pectoris: Secondary | ICD-10-CM

## 2023-04-19 DIAGNOSIS — Z8679 Personal history of other diseases of the circulatory system: Secondary | ICD-10-CM | POA: Diagnosis not present

## 2023-04-19 DIAGNOSIS — E669 Obesity, unspecified: Secondary | ICD-10-CM | POA: Insufficient documentation

## 2023-04-19 DIAGNOSIS — D3501 Benign neoplasm of right adrenal gland: Secondary | ICD-10-CM | POA: Diagnosis not present

## 2023-04-19 DIAGNOSIS — I4891 Unspecified atrial fibrillation: Secondary | ICD-10-CM | POA: Diagnosis not present

## 2023-04-19 DIAGNOSIS — R0789 Other chest pain: Secondary | ICD-10-CM | POA: Diagnosis not present

## 2023-04-19 LAB — COMPREHENSIVE METABOLIC PANEL
ALT: 13 U/L (ref 0–44)
AST: 12 U/L — ABNORMAL LOW (ref 15–41)
Albumin: 3.7 g/dL (ref 3.5–5.0)
Alkaline Phosphatase: 51 U/L (ref 38–126)
Anion gap: 8 (ref 5–15)
BUN: 11 mg/dL (ref 6–20)
CO2: 23 mmol/L (ref 22–32)
Calcium: 9.6 mg/dL (ref 8.9–10.3)
Chloride: 103 mmol/L (ref 98–111)
Creatinine, Ser: 0.94 mg/dL (ref 0.44–1.00)
GFR, Estimated: >60 mL/min (ref >60)
Glucose, Bld: 109 mg/dL — ABNORMAL HIGH (ref 70–99)
Potassium: 4.4 mmol/L (ref 3.5–5.1)
Sodium: 134 mmol/L — ABNORMAL LOW (ref 135–145)
Total Bilirubin: 1.5 mg/dL — ABNORMAL HIGH (ref 0.3–1.2)
Total Protein: 7.2 g/dL (ref 6.5–8.1)

## 2023-04-19 LAB — LIPID PANEL
Cholesterol: 220 mg/dL — ABNORMAL HIGH (ref 0–200)
HDL: 53 mg/dL (ref >40)
LDL Cholesterol: 149 mg/dL — ABNORMAL HIGH (ref 0–99)
Total CHOL/HDL Ratio: 4.2 {ratio}
Triglycerides: 89 mg/dL (ref <150)
VLDL: 18 mg/dL (ref 0–40)

## 2023-04-19 LAB — CBC
HCT: 43.7 % (ref 36.0–46.0)
Hemoglobin: 15 g/dL (ref 12.0–15.0)
MCH: 32 pg (ref 26.0–34.0)
MCHC: 34.3 g/dL (ref 30.0–36.0)
MCV: 93.2 fL (ref 80.0–100.0)
Platelets: 333 10*3/uL (ref 150–400)
RBC: 4.69 MIL/uL (ref 3.87–5.11)
RDW: 12.7 % (ref 11.5–15.5)
WBC: 7.5 10*3/uL (ref 4.0–10.5)
nRBC: 0 % (ref 0.0–0.2)

## 2023-04-19 SURGERY — TRANSESOPHAGEAL ECHOCARDIOGRAM (TEE) (CATHLAB)
Anesthesia: Monitor Anesthesia Care

## 2023-04-19 MED ORDER — METOPROLOL SUCCINATE ER 50 MG PO TB24: 50.0000 mg | ORAL_TABLET | Freq: Two times a day (BID) | ORAL | 0 refills | Status: DC

## 2023-04-19 MED ORDER — FLECAINIDE ACETATE 150 MG PO TABS: 300.0000 mg | ORAL_TABLET | Freq: Every day | ORAL | 3 refills | Status: DC | PRN

## 2023-04-19 MED ORDER — POTASSIUM CHLORIDE 20 MEQ PO PACK: 20.0000 meq | PACK | Freq: Every day | ORAL | 0 refills | Status: DC

## 2023-04-19 MED ORDER — MAGNESIUM OXIDE 400 MG PO CAPS: 400.0000 mg | ORAL_CAPSULE | Freq: Two times a day (BID) | ORAL | Status: AC

## 2023-04-19 MED ORDER — APIXABAN 5 MG PO TABS: 5.0000 mg | ORAL_TABLET | Freq: Two times a day (BID) | ORAL | 0 refills | Status: DC

## 2023-04-19 NOTE — Progress Notes (Signed)
   04/19/23 1047  TOC Brief Assessment  Insurance and Status Reviewed  Patient has primary care physician Yes  Home environment has been reviewed yes  Prior level of function: independent  Prior/Current Home Services No current home services  Social Determinants of Health Reivew SDOH reviewed no interventions necessary  Readmission risk has been reviewed Yes  Transition of care needs no transition of care needs at this time     Provided patient with Eliquis 30 day free card and $10 co pay card

## 2023-04-19 NOTE — Hospital Course (Signed)
HPI: Tracy Decker is a 57 y.o. 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  Patient reported that she has having sensation of palpitations/aching in her chest.  Found to be A-fib RVR with EMS.  Patient was given 100 cc of normal saline and no added medication. At sensation to ED patient complaining about shortness of breath. Patient has history of bilateral adrenal hyperplasia/adenoma status post left-sided adenectomy in the past however patient is still has right-sided adrenal hyperplasia and adenoma.  No established outpatient endocrinology care.   Patient has establish care with cardiology last clinic visit in May 2023.  She used to follow cardiology for management for hypertension and nonbreast obstructing CAD.  Currently at home patient is on aspirin, statin, chlorthalidone, carvedilol and spironolactone. Per chart review, hospitalization from 06/2020 reviewed. Noted to have mild, largely flat elevation in hsTnI (104, 122, 153). CT cardiac with Ca score of 3 and minimal nonobstructive CAD. Echo hyperdynamic, LVH noted.   My evaluation at bedside patient reported that she has intermittent episodes of chest pain that started around 1 PM tonight.  It feels like that her heart is moving around and having palpitation sensation.  Patient denies radiation of the chest pain to the left side of the jaw, neck.  Denies any diaphoresis.  Patient is telling me when she was in [place] she used to have a nephrologist who was taking care of okay I do not problem.  She has left-sided adrenal gland removal in 2013 while in [place] and currently  hyperaldosteronism/right-sided adrenal gland adenoma is managing by primary care provider at [facility].  Patient does not have any established endocrine or nephrology over ER at [facility].  She has been relocated 3 years ago at [facility]. Patient denies any headache, nausea, abdominal pain and diarrhea.  She is endorsing chronic constipation.  She also has nighttime sweat.  Significant Events: Admitted 04/18/2023 for new onset afib Converted to NSR on evening of 04-18-2023  Significant Labs:   Significant Imaging Studies: Echo LVEF 65-70%  Antibiotic Therapy: Anti-infectives (From admission, onward)    None       Procedures:   Consultants: cardiology

## 2023-04-19 NOTE — Progress Notes (Signed)
PROGRESS NOTE    Tracy Decker  XBM:[id number] DOB: 11/19/1965 DOA: 04/18/2023 PCP: Truett Perna, MD  Subjective: Pt seen and examined. Mother at bedside Converted to NSR. TEE/DCCV canceled by cards.   Hospital Course: HPI: Tracy Decker is a 57 y.o. 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  Patient reported that she has having sensation of palpitations/aching in her chest.  Found to be A-fib RVR with EMS.  Patient was given 100 cc of normal saline and no added medication. At sensation to ED patient complaining about shortness of breath. Patient has history of bilateral adrenal hyperplasia/adenoma status post left-sided adenectomy in the past however patient is still has right-sided adrenal hyperplasia and adenoma.  No established outpatient endocrinology care.   Patient has establish care with cardiology last clinic visit in May 2023.  She used to follow cardiology for management for hypertension and nonbreast obstructing CAD.  Currently at home patient is on aspirin, statin, chlorthalidone, carvedilol and spironolactone. Per chart review, hospitalization from 06/2020 reviewed. Noted to have mild, largely flat elevation in hsTnI (104, 122, 153). CT cardiac with Ca score of 3 and minimal nonobstructive CAD. Echo hyperdynamic, LVH noted.   My evaluation at bedside patient reported that she has intermittent episodes of chest pain that started around 1 PM tonight.  It feels like that her heart is moving around and having palpitation sensation.  Patient denies radiation of the chest pain to the left side of the jaw, neck.  Denies  any diaphoresis.  Patient is telling me when she was in [place] she used to have a nephrologist who was taking care of okay I do not problem.  She has left-sided adrenal gland removal in 2013 while in [place] and currently hyperaldosteronism/right-sided adrenal gland adenoma is managing by primary care provider at [facility].  Patient does not have any established endocrine or nephrology over ER at [facility].  She has been relocated 3 years ago at [facility]. Patient denies any headache, nausea, abdominal pain and diarrhea.  She is endorsing chronic constipation.  She also has nighttime sweat.  Significant Events: Admitted 04/18/2023 for new onset afib Converted to NSR on evening of 04-18-2023  Significant Labs:   Significant Imaging Studies: Echo LVEF 65-70%  Antibiotic Therapy: Anti-infectives (From admission, onward)    None       Procedures:   Consultants: cardiology    Assessment and Plan: * Atrial fibrillation with RVR (HCC) Converted to NSR on evening of 04-18-2023. Pt's TEE/DCCV canceled by cardiology. Cardiology has chosen to go with pill-in-pocket strategy with flecainide. Pt will continue with Eliquis 5 mg bid. Stop ASA. F/u with cards in 2 weeks. Keep K >4 and Mg >2.0. may need outpatient BMP, Mg levels at outpatient cards appointment.  Adrenal adenoma, right Stable.  History of CAD (coronary artery disease) Stable. Stop ASA since pt going to be on Eliquis for CVA prophylaxis for afib.  GAD (generalized anxiety disorder) Stable. On lexapro and buspar.  Essential hypertension Pt states she does not take her BP meds as she should. 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 She will need to discuss HTN management and other medication modifications to allow her to continue to work and take her meds. She may need to  PROGRESS NOTE    Tracy Decker  XBM:[id number] DOB: 11/19/1965 DOA: 04/18/2023 PCP: Truett Perna, MD  Subjective: Pt seen and examined. Mother at bedside Converted to NSR. TEE/DCCV canceled by cards.   Hospital Course: HPI: Tracy Decker is a 57 y.o. 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  Patient reported that she has having sensation of palpitations/aching in her chest.  Found to be A-fib RVR with EMS.  Patient was given 100 cc of normal saline and no added medication. At sensation to ED patient complaining about shortness of breath. Patient has history of bilateral adrenal hyperplasia/adenoma status post left-sided adenectomy in the past however patient is still has right-sided adrenal hyperplasia and adenoma.  No established outpatient endocrinology care.   Patient has establish care with cardiology last clinic visit in May 2023.  She used to follow cardiology for management for hypertension and nonbreast obstructing CAD.  Currently at home patient is on aspirin, statin, chlorthalidone, carvedilol and spironolactone. Per chart review, hospitalization from 06/2020 reviewed. Noted to have mild, largely flat elevation in hsTnI (104, 122, 153). CT cardiac with Ca score of 3 and minimal nonobstructive CAD. Echo hyperdynamic, LVH noted.   My evaluation at bedside patient reported that she has intermittent episodes of chest pain that started around 1 PM tonight.  It feels like that her heart is moving around and having palpitation sensation.  Patient denies radiation of the chest pain to the left side of the jaw, neck.  Denies  any diaphoresis.  Patient is telling me when she was in [place] she used to have a nephrologist who was taking care of okay I do not problem.  She has left-sided adrenal gland removal in 2013 while in [place] and currently hyperaldosteronism/right-sided adrenal gland adenoma is managing by primary care provider at [facility].  Patient does not have any established endocrine or nephrology over ER at [facility].  She has been relocated 3 years ago at [facility]. Patient denies any headache, nausea, abdominal pain and diarrhea.  She is endorsing chronic constipation.  She also has nighttime sweat.  Significant Events: Admitted 04/18/2023 for new onset afib Converted to NSR on evening of 04-18-2023  Significant Labs:   Significant Imaging Studies: Echo LVEF 65-70%  Antibiotic Therapy: Anti-infectives (From admission, onward)    None       Procedures:   Consultants: cardiology    Assessment and Plan: * Atrial fibrillation with RVR (HCC) Converted to NSR on evening of 04-18-2023. Pt's TEE/DCCV canceled by cardiology. Cardiology has chosen to go with pill-in-pocket strategy with flecainide. Pt will continue with Eliquis 5 mg bid. Stop ASA. F/u with cards in 2 weeks. Keep K >4 and Mg >2.0. may need outpatient BMP, Mg levels at outpatient cards appointment.  Adrenal adenoma, right Stable.  History of CAD (coronary artery disease) Stable. Stop ASA since pt going to be on Eliquis for CVA prophylaxis for afib.  GAD (generalized anxiety disorder) Stable. On lexapro and buspar.  Essential hypertension Pt states she does not take her BP meds as she should. 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 She will need to discuss HTN management and other medication modifications to allow her to continue to work and take her meds. She may need to  the past 240 hour(s))  MRSA Next Gen by PCR, Nasal     Status: None   Collection Time: 04/18/23  6:15 AM   Specimen: Nasal Mucosa; Nasal Swab  Result Value Ref Range Status   MRSA by PCR Next Gen NOT DETECTED NOT DETECTED Final    Comment: (NOTE) The GeneXpert MRSA Assay (FDA approved for NASAL specimens only), is one component of a comprehensive MRSA colonization surveillance program. It is not intended to diagnose MRSA infection nor to guide or monitor treatment for MRSA infections. Test performance is not FDA approved in patients less than 2 years old. Performed at [facility], [address], [place], [place] [address]      Radiology Studies: ECHOCARDIOGRAM COMPLETE  Result Date: 04/18/2023    ECHOCARDIOGRAM REPORT   Patient Name:   Tracy Decker Date of Exam: 04/18/2023 Medical Rec #:  [id number]      Height:       64.0 in Accession #:    [id number]     Weight:       185.8 lb Date of Birth:  [birth year]      BSA:          1.896 m Patient Age:    57 years       BP:           133/77 mmHg Patient Gender: F              HR:           97 bpm. Exam Location:   Inpatient Procedure: 2D Echo, Cardiac Doppler and Color Doppler Indications:    Chest pain, Afib  History:        Patient has prior history of Echocardiogram examinations, most                 recent 07/15/2020. Arrythmias:Atrial Fibrillation; Risk                 Factors:Family History of Coronary Artery Disease.  Sonographer:    Karma Ganja Referring Phys: [id number] SUBRINA SUNDIL IMPRESSIONS  1. Left ventricular ejection fraction, by estimation, is 65 to 70%. The left ventricle has normal function. The left ventricle has no regional wall motion abnormalities. There is moderate left ventricular hypertrophy. Left ventricular diastolic parameters are indeterminate.  2. Right ventricular systolic function is normal. The right ventricular size is normal. There is normal pulmonary artery systolic pressure.  3. Left atrial size was mildly dilated.  4. There is no evidence of cardiac tamponade.  5. The mitral valve is normal in structure. Mild mitral valve regurgitation.  6. The aortic valve is normal in structure. Aortic valve regurgitation is not visualized. No aortic stenosis is present. FINDINGS  Left Ventricle: Left ventricular ejection fraction, by estimation, is 65 to 70%. The left ventricle has normal function. The left ventricle has no regional wall motion abnormalities. The left ventricular internal cavity size was normal in size. There is  moderate left ventricular hypertrophy. Left ventricular diastolic parameters are indeterminate. Right Ventricle: The right ventricular size is normal. No increase in right ventricular wall thickness. Right ventricular systolic function is normal. There is normal pulmonary artery systolic pressure. The tricuspid regurgitant velocity is 2.65 m/s, and  with an assumed right atrial pressure of 3 mmHg, the estimated right ventricular systolic pressure is 31.1 mmHg. Left Atrium: Left atrial size was mildly dilated. Right Atrium: Right atrial size was normal in size. Pericardium:  Trivial pericardial effusion is present. There is no  the past 240 hour(s))  MRSA Next Gen by PCR, Nasal     Status: None   Collection Time: 04/18/23  6:15 AM   Specimen: Nasal Mucosa; Nasal Swab  Result Value Ref Range Status   MRSA by PCR Next Gen NOT DETECTED NOT DETECTED Final    Comment: (NOTE) The GeneXpert MRSA Assay (FDA approved for NASAL specimens only), is one component of a comprehensive MRSA colonization surveillance program. It is not intended to diagnose MRSA infection nor to guide or monitor treatment for MRSA infections. Test performance is not FDA approved in patients less than 2 years old. Performed at [facility], [address], [place], [place] [address]      Radiology Studies: ECHOCARDIOGRAM COMPLETE  Result Date: 04/18/2023    ECHOCARDIOGRAM REPORT   Patient Name:   Tracy Decker Date of Exam: 04/18/2023 Medical Rec #:  [id number]      Height:       64.0 in Accession #:    [id number]     Weight:       185.8 lb Date of Birth:  [birth year]      BSA:          1.896 m Patient Age:    57 years       BP:           133/77 mmHg Patient Gender: F              HR:           97 bpm. Exam Location:   Inpatient Procedure: 2D Echo, Cardiac Doppler and Color Doppler Indications:    Chest pain, Afib  History:        Patient has prior history of Echocardiogram examinations, most                 recent 07/15/2020. Arrythmias:Atrial Fibrillation; Risk                 Factors:Family History of Coronary Artery Disease.  Sonographer:    Karma Ganja Referring Phys: [id number] SUBRINA SUNDIL IMPRESSIONS  1. Left ventricular ejection fraction, by estimation, is 65 to 70%. The left ventricle has normal function. The left ventricle has no regional wall motion abnormalities. There is moderate left ventricular hypertrophy. Left ventricular diastolic parameters are indeterminate.  2. Right ventricular systolic function is normal. The right ventricular size is normal. There is normal pulmonary artery systolic pressure.  3. Left atrial size was mildly dilated.  4. There is no evidence of cardiac tamponade.  5. The mitral valve is normal in structure. Mild mitral valve regurgitation.  6. The aortic valve is normal in structure. Aortic valve regurgitation is not visualized. No aortic stenosis is present. FINDINGS  Left Ventricle: Left ventricular ejection fraction, by estimation, is 65 to 70%. The left ventricle has normal function. The left ventricle has no regional wall motion abnormalities. The left ventricular internal cavity size was normal in size. There is  moderate left ventricular hypertrophy. Left ventricular diastolic parameters are indeterminate. Right Ventricle: The right ventricular size is normal. No increase in right ventricular wall thickness. Right ventricular systolic function is normal. There is normal pulmonary artery systolic pressure. The tricuspid regurgitant velocity is 2.65 m/s, and  with an assumed right atrial pressure of 3 mmHg, the estimated right ventricular systolic pressure is 31.1 mmHg. Left Atrium: Left atrial size was mildly dilated. Right At[facility]: Right atrial size was normal in size. Pericardium:  Trivial pericardial effusion is present. There is no  PROGRESS NOTE    Tracy Decker  XBM:[id number] DOB: 11/19/1965 DOA: 04/18/2023 PCP: Truett Perna, MD  Subjective: Pt seen and examined. Mother at bedside Converted to NSR. TEE/DCCV canceled by cards.   Hospital Course: HPI: Tracy Decker is a 57 y.o. 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  Patient reported that she has having sensation of palpitations/aching in her chest.  Found to be A-fib RVR with EMS.  Patient was given 100 cc of normal saline and no added medication. At sensation to ED patient complaining about shortness of breath. Patient has history of bilateral adrenal hyperplasia/adenoma status post left-sided adenectomy in the past however patient is still has right-sided adrenal hyperplasia and adenoma.  No established outpatient endocrinology care.   Patient has establish care with cardiology last clinic visit in May 2023.  She used to follow cardiology for management for hypertension and nonbreast obstructing CAD.  Currently at home patient is on aspirin, statin, chlorthalidone, carvedilol and spironolactone. Per chart review, hospitalization from 06/2020 reviewed. Noted to have mild, largely flat elevation in hsTnI (104, 122, 153). CT cardiac with Ca score of 3 and minimal nonobstructive CAD. Echo hyperdynamic, LVH noted.   My evaluation at bedside patient reported that she has intermittent episodes of chest pain that started around 1 PM tonight.  It feels like that her heart is moving around and having palpitation sensation.  Patient denies radiation of the chest pain to the left side of the jaw, neck.  Denies  any diaphoresis.  Patient is telling me when she was in [place] she used to have a nephrologist who was taking care of okay I do not problem.  She has left-sided adrenal gland removal in 2013 while in [place] and currently hyperaldosteronism/right-sided adrenal gland adenoma is managing by primary care provider at [facility].  Patient does not have any established endocrine or nephrology over ER at [facility].  She has been relocated 3 years ago at [facility]. Patient denies any headache, nausea, abdominal pain and diarrhea.  She is endorsing chronic constipation.  She also has nighttime sweat.  Significant Events: Admitted 04/18/2023 for new onset afib Converted to NSR on evening of 04-18-2023  Significant Labs:   Significant Imaging Studies: Echo LVEF 65-70%  Antibiotic Therapy: Anti-infectives (From admission, onward)    None       Procedures:   Consultants: cardiology    Assessment and Plan: * Atrial fibrillation with RVR (HCC) Converted to NSR on evening of 04-18-2023. Pt's TEE/DCCV canceled by cardiology. Cardiology has chosen to go with pill-in-pocket strategy with flecainide. Pt will continue with Eliquis 5 mg bid. Stop ASA. F/u with cards in 2 weeks. Keep K >4 and Mg >2.0. may need outpatient BMP, Mg levels at outpatient cards appointment.  Adrenal adenoma, right Stable.  History of CAD (coronary artery disease) Stable. Stop ASA since pt going to be on Eliquis for CVA prophylaxis for afib.  GAD (generalized anxiety disorder) Stable. On lexapro and buspar.  Essential hypertension Pt states she does not take her BP meds as she should. 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 She will need to discuss HTN management and other medication modifications to allow her to continue to work and take her meds. She may need to

## 2023-04-19 NOTE — Discharge Summary (Addendum)
[facility] Phys[facility] Discharge Summary   Patient name: Tracy Decker  Admit date:     04/18/2023  Discharge date: 04/19/2023  Attending Physician: Tereasa Coop [[id number]]  Discharge Physician: Carollee Herter   PCP: Truett Perna, MD  Admitted From: Home Disposition:  Home  Recommendations for Outpatient Follow-up:  Follow up with PCP in 1-2 weeks Follow up with cardiology in 2 weeks. May 09, 2023 @ 2:20 pm  Home Health:No Equipment/Devices: None  Discharge Condition:Stable CODE STATUS:FULL Diet recommendation: Heart Healthy Fluid Restriction: None  Hospital Summary: HPI: Shareta Kuenzi is a 57 y.o. 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  Patient reported that she has having sensation of palpitations/aching in her chest.  Found to be A-fib RVR with EMS.  Patient was given 100 cc of normal saline and no added medication. At sensation to ED patient complaining about shortness of breath. Patient has history of bilateral adrenal hyperplasia/adenoma status post left-sided adenectomy in the past however patient is still has right-sided adrenal hyperplasia and adenoma.  No established outpatient endocrinology care.   Patient has establish care with cardiology last clinic visit in May 2023.  She used to follow cardiology for management for hypertension and nonbreast obstructing CAD.  Currently at home patient is on aspirin, statin, chlorthalidone, carvedilol and spironolactone. Per chart review, hospitalization from 06/2020 reviewed. Noted to have mild, largely flat elevation in hsTnI (104, 122, 153). CT cardiac  with Ca score of 3 and minimal nonobstructive CAD. Echo hyperdynamic, LVH noted.   My evaluation at bedside patient reported that she has intermittent episodes of chest pain that started around 1 PM tonight.  It feels like that her heart is moving around and having palpitation sensation.  Patient denies radiation of the chest pain to the left side of the jaw, neck.  Denies any diaphoresis.  Patient is telling me when she was in [place] she used to have a nephrologist who was taking care of okay I do not problem.  She has left-sided adrenal gland removal in 2013 while in [place] and currently hyperaldosteronism/right-sided adrenal gland adenoma is managing by primary care provider at [facility].  Patient does not have any established endocrine or nephrology over ER at [facility].  She has been relocated 3 years ago at [facility]. Patient denies any headache, nausea, abdominal pain and diarrhea.  She is endorsing chronic constipation.  She also has nighttime sweat.  Significant Events: Admitted 04/18/2023 for new onset afib Converted to NSR on evening of 04-18-2023  Significant Labs:   Significant Imaging Studies: Echo LVEF 65-70%  Antibiotic Therapy: Anti-infectives (From admission, onward)    None       Procedures:   Consultants: cardiology   Hospital Course by Problem: * Atrial fibrillation with RVR (HCC) Converted to NSR on evening of 04-18-2023. Pt's TEE/DCCV canceled by cardiology. Cardiology has chosen to go with pill-in-pocket strategy with flecainide. Pt will continue with Eliquis 5 mg bid. Stop ASA. F/u with cards in 2 weeks. Keep K >4 and Mg >2.0. may need outpatient BMP, Mg levels at outpatient cards appointment.  Adrenal adenoma, right Stable.  History of CAD (coronary artery disease) Stable. Stop ASA since pt going to be on Eliquis for CVA prophylaxis for afib.  Obesity (BMI 30-39.9) BMI  31.45  GAD (generalized anxiety disorder) Stable. On lexapro and  buspar.  Essential hypertension Pt states she does not take her BP meds as she should. 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 She will need to discuss HTN management and other medication modifications to allow her to continue to work and take her meds. She may need to stop taking hydrochlorothiazide and replace this with alternative HTN meds. Will defer to PCP on best strategy for this.  Chest pain Resolved. Due to rapid afib.    Discharge Diagnoses:  Principal Problem:   Atrial fibrillation with RVR (HCC) Active Problems:   History of CAD (coronary artery disease)   Adrenal adenoma, right   Chest pain   Essential hypertension   GAD (generalized anxiety disorder)   Obesity (BMI 30-39.9)   Discharge Instructions  Discharge Instructions     Call MD for:  difficulty breathing, headache or visual disturbances   Complete by: As directed    Call MD for:  extreme fatigue   Complete by: As directed    Call MD for:  hives   Complete by: As directed    Call MD for:  persistant dizziness or light-headedness   Complete by: As directed    Call MD for:  persistant nausea and vomiting   Complete by: As directed    Call MD for:  redness, tenderness, or signs of infection (pain, swelling, redness, odor or green/yellow discharge around incision site)   Complete by: As directed    Call MD for:  severe uncontrolled pain   Complete by: As directed    Call MD for:  temperature >100.4   Complete by: As directed    Diet - low sodium heart healthy   Complete by: As directed    Discharge instructions   Complete by: As directed    1. Follow up with primary care provider in 1-2 weeks following discharge 2. Follow up with cardiology on 05-09-2023 @ 2:20 pm   Increase activity slowly   Complete by: As directed       Allergies as of 04/19/2023       Reactions   Ibuprofen Other (See Comments)   Causes increase in BP        Medication List     STOP taking these  medications    aspirin EC 81 MG tablet       TAKE these medications    apixaban 5 MG Tabs tablet Commonly known as: ELIQUIS Take 1 tablet (5 mg total) by mouth 2 (two) times daily.   buPROPion 100 MG tablet Commonly known as: WELLBUTRIN Take 100 mg by mouth daily.   busPIRone 5 MG tablet Commonly known as: BUSPAR Take 5 mg by mouth daily as needed (for anxiety).   escitalopram 20 MG tablet Commonly known as: LEXAPRO Take 20 mg by mouth daily.   flecainide 150 MG tablet Commonly known as: TAMBOCOR Take 2 tablets (300 mg total) by mouth daily as needed (for atrial fibrillation; do not take more than one in 24 hours).   Fluocinolone Acetonide Scalp 0.01 % Oil Apply 1 application topically daily as needed for irritation.   hydrochlorothiazide 12.5 MG capsule Commonly known as: MICROZIDE Take 12.5 mg by mouth daily.   Magnesium Oxide 400 MG Caps Take 1 capsule (400 mg total) by mouth 2 (two) times daily.   metoprolol succinate 50 MG 24 hr tablet Commonly known as: TOPROL-XL Take 1 tablet (50 mg total) by mouth 2 (two) times  daily. What changed: when to take this   metroNIDAZOLE 0.75 % vaginal gel Commonly known as: METROGEL Place 1 Applicatorful vaginally at bedtime. Apply one applicatorful to vagina at bedtime for 10 days, then twice a week for 6 months. What changed:  when to take this reasons to take this additional instructions   minoxidil 2.5 MG tablet Commonly known as: LONITEN Take 2.5 mg by mouth daily.   pantoprazole 40 MG tablet Commonly known as: PROTONIX Take 1 tablet (40 mg total) by mouth daily.   potassium chloride 20 MEQ packet Commonly known as: KLOR-CON Take 20 mEq by mouth daily.   rosuvastatin 40 MG tablet Commonly known as: CRESTOR Take 1 tablet (40 mg total) by mouth daily.   spironolactone 50 MG tablet Commonly known as: ALDACTONE Take 50 mg by mouth daily.   valsartan 320 MG tablet Commonly known as: DIOVAN Take 1 tablet  (320 mg total) by mouth daily.   vitamin D3 50 MCG (2000 UT) Caps Take 2,000 Units by mouth daily.   zolpidem 10 MG tablet Commonly known as: AMBIEN Take 10 mg by mouth at bedtime as needed for sleep.        Follow-up Information     Alver Sorrow, NP. Go on 05/09/2023.   Specialty: Cardiology Why: Please go to cardiology follow up appt on 05/09/23 at 2:20 PM with Gillian Shields, NP at the [facility] office Contact information: [address] [place] [address] [phone]                Allergies  Allergen Reactions   Ibuprofen Other (See Comments)    Causes increase in BP    Discharge Exam: Vitals:   04/19/23 0328 04/19/23 0800  BP: (!) 148/94 (!) 150/83  Pulse: 60   Resp: 15   Temp: 98.3 F (36.8 C) 98.4 F (36.9 C)  SpO2: 98%     Physical Exam Vitals and nursing note reviewed.  Constitutional:      General: She is not in acute distress.    Appearance: She is not ill-appearing, toxic-appearing or diaphoretic.  Eyes:     General: No scleral icterus. Cardiovascular:     Rate and Rhythm: Normal rate and regular rhythm.  Pulmonary:     Effort: Pulmonary effort is normal. No respiratory distress.  Abdominal:     General: There is no distension.  Skin:    Capillary Refill: Capillary refill takes less than 2 seconds.  Neurological:     General: No focal deficit present.     Mental Status: She is alert and oriented to person, place, and time.     The results of significant diagnostics from this hospitalization (including imaging, microbiology, ancillary and laboratory) are listed below for reference.    Microbiology: Recent Results (from the past 240 hour(s))  MRSA Next Gen by PCR, Nasal     Status: None   Collection Time: 04/18/23  6:15 AM   Specimen: Nasal Mucosa; Nasal Swab  Result Value Ref Range Status   MRSA by PCR Next Gen NOT DETECTED NOT DETECTED Final    Comment: (NOTE) The GeneXpert MRSA Assay (FDA approved for NASAL  specimens only), is one component of a comprehensive MRSA colonization surveillance program. It is not intended to diagnose MRSA infection nor to guide or monitor treatment for MRSA infections. Test performance is not FDA approved in patients less than 50 years old. Performed at [facility], [address], [place], [place] [address]  Labs: Basic Metabolic Panel: Recent Labs  Lab 04/18/23 0215 04/18/23 0452 04/19/23 0236  NA 137 138 134*  K 4.2 4.2 4.4  CL 105 108 103  CO2 20* 22 23  GLUCOSE 121* 113* 109*  BUN 16 15 11   CREATININE 0.93 0.85 0.94  CALCIUM 9.4 8.9 9.6   Liver Function Tests: Recent Labs  Lab 04/18/23 0215 04/18/23 0452 04/19/23 0236  AST 13* 12* 12*  ALT 11 12 13   ALKPHOS 61 54 51  BILITOT 0.7 0.6 1.5*  PROT 7.3 6.9 7.2  ALBUMIN 3.9 3.6 3.7   CBC: Recent Labs  Lab 04/18/23 0215 04/18/23 0452 04/19/23 0236  WBC 9.7 8.6 7.5  HGB 14.5 14.1 15.0  HCT 43.7 42.1 43.7  MCV 96.9 94.8 93.2  PLT 327 333 333   Hgb A1c Recent Labs    04/18/23 1140  HGBA1C 5.7*   Lipid Profile Recent Labs    04/19/23 0236  CHOL 220*  HDL 53  LDLCALC 149*  TRIG 89  CHOLHDL 4.2   Thyroid function studies Recent Labs    04/18/23 0215  TSH 2.288   Sepsis Labs Recent Labs  Lab 04/18/23 0215 04/18/23 0452 04/19/23 0236  WBC 9.7 8.6 7.5   Microbiology Recent Results (from the past 240 hour(s))  MRSA Next Gen by PCR, Nasal     Status: None   Collection Time: 04/18/23  6:15 AM   Specimen: Nasal Mucosa; Nasal Swab  Result Value Ref Range Status   MRSA by PCR Next Gen NOT DETECTED NOT DETECTED Final    Comment: (NOTE) The GeneXpert MRSA Assay (FDA approved for NASAL specimens only), is one component of a comprehensive MRSA colonization surveillance program. It is not intended to diagnose MRSA infection nor to guide or monitor treatment for MRSA infections. Test performance is not FDA approved in patients less than 71 years old. Performed  at [facility], [address], [place], [place] [address]     Procedures/Studies: ECHOCARDIOGRAM COMPLETE  Result Date: 04/18/2023    ECHOCARDIOGRAM REPORT   Patient Name:   LATARYA COLFER Date of Exam: 04/18/2023 Medical Rec #:  [id number]      Height:       64.0 in Accession #:    [id number]     Weight:       185.8 lb Date of Birth:  1965/10/22      BSA:          1.896 m Patient Age:    57 years       BP:           133/77 mmHg Patient Gender: F              HR:           97 bpm. Exam Location:  Inpatient Procedure: 2D Echo, Cardiac Doppler and Color Doppler Indications:    Chest pain, Afib  History:        Patient has prior history of Echocardiogram examinations, most                 recent 07/15/2020. Arrythmias:Atrial Fibrillation; Risk                 Factors:Family History of Coronary Artery Disease.  Sonographer:    Karma Ganja Referring Phys: [id number] SUBRINA SUNDIL IMPRESSIONS  1. Left ventricular ejection fraction, by estimation, is 65 to 70%. The left ventricle has normal function. The left ventricle has no regional wall motion abnormalities. There is  moderate left ventricular hypertrophy. Left ventricular diastolic parameters are indeterminate.  2. Right ventricular systolic function is normal. The right ventricular size is normal. There is normal pulmonary artery systolic pressure.  3. Left atrial size was mildly dilated.  4. There is no evidence of cardiac tamponade.  5. The mitral valve is normal in structure. Mild mitral valve regurgitation.  6. The aortic valve is normal in structure. Aortic valve regurgitation is not visualized. No aortic stenosis is present. FINDINGS  Left Ventricle: Left ventricular ejection fraction, by estimation, is 65 to 70%. The left ventricle has normal function. The left ventricle has no regional wall motion abnormalities. The left ventricular internal cavity size was normal in size. There is  moderate left ventricular hypertrophy. Left ventricular diastolic  parameters are indeterminate. Right Ventricle: The right ventricular size is normal. No increase in right ventricular wall thickness. Right ventricular systolic function is normal. There is normal pulmonary artery systolic pressure. The tricuspid regurgitant velocity is 2.65 m/s, and  with an assumed right atrial pressure of 3 mmHg, the estimated right ventricular systolic pressure is 31.1 mmHg. Left Atrium: Left atrial size was mildly dilated. Right Atrium: Right atrial size was normal in size. Pericardium: Trivial pericardial effusion is present. There is no evidence of cardiac tamponade. Mitral Valve: The mitral valve is normal in structure. Mild mitral valve regurgitation. Tricuspid Valve: The tricuspid valve is normal in structure. Tricuspid valve regurgitation is mild. Aortic Valve: The aortic valve is normal in structure. Aortic valve regurgitation is not visualized. No aortic stenosis is present. Aortic valve mean gradient measures 6.0 mmHg. Aortic valve peak gradient measures 11.5 mmHg. Aortic valve area, by VTI measures 2.79 cm. Pulmonic Valve: The pulmonic valve was grossly normal. Pulmonic valve regurgitation is trivial. Aorta: The aortic root and ascending aorta are structurally normal, with no evidence of dilitation. IAS/Shunts: No atrial level shunt detected by color flow Doppler.  LEFT VENTRICLE PLAX 2D LVIDd:         3.60 cm   Diastology LVIDs:         1.90 cm   LV e' medial:    6.72 cm/s LV PW:         1.40 cm   LV E/e' medial:  16.0 LV IVS:        1.50 cm   LV e' lateral:   9.24 cm/s LVOT diam:     1.80 cm   LV E/e' lateral: 11.6 LV SV:         77 LV SV Index:   41 LVOT Area:     2.54 cm  RIGHT VENTRICLE             IVC RV Basal diam:  3.40 cm     IVC diam: 1.50 cm RV S prime:     11.76 cm/s TAPSE (M-mode): 1.9 cm LEFT ATRIUM             Index        RIGHT ATRIUM           Index LA diam:        4.50 cm 2.37 cm/m   RA Area:     13.90 cm LA Vol (A2C):   51.8 ml 27.32 ml/m  RA Volume:   37.00 ml   19.51 ml/m LA Vol (A4C):   68.0 ml 35.86 ml/m LA Biplane Vol: 59.7 ml 31.48 ml/m  AORTIC VALVE AV Area (Vmax):    2.99 cm AV Area (Vmean):   2.97 cm AV  Area (VTI):     2.79 cm AV Vmax:           169.33 cm/s AV Vmean:          113.667 cm/s AV VTI:            0.276 m AV Peak Grad:      11.5 mmHg AV Mean Grad:      6.0 mmHg LVOT Vmax:         199.00 cm/s LVOT Vmean:        132.567 cm/s LVOT VTI:          0.302 m LVOT/AV VTI ratio: 1.10  AORTA Ao Root diam: 2.40 cm Ao Asc diam:  2.90 cm MITRAL VALVE                TRICUSPID VALVE MV Area (PHT): 3.27 cm     TR Peak grad:   28.1 mmHg MV Decel Time: 232 msec     TR Vmax:        265.00 cm/s MV E velocity: 107.25 cm/s                             SHUNTS                             Systemic VTI:  0.30 m                             Systemic Diam: 1.80 cm Clearnce Hasten Electronically signed by Clearnce Hasten Signature Date/Time: 04/18/2023/2:35:23 PM    Final    DG Chest Portable 1 View  Result Date: 04/18/2023 CLINICAL DATA:  Chest pain EXAM: PORTABLE CHEST 1 VIEW COMPARISON:  07/15/2020 FINDINGS: The heart size and mediastinal contours are within normal limits. Both lungs are clear. The visualized skeletal structures are unremarkable. IMPRESSION: No active disease. Electronically Signed   By: Minerva Fester M.D.   On: 04/18/2023 02:31    Time coordinating discharge: 35 mins  SIGNED:  Carollee Herter, DO [facility] 04/19/23, 10:29 AM

## 2023-04-19 NOTE — Assessment & Plan Note (Signed)
Stable

## 2023-04-19 NOTE — Assessment & Plan Note (Signed)
Converted to NSR on evening of 04-18-2023. Pt's TEE/DCCV canceled by cardiology. Cardiology has chosen to go with pill-in-pocket strategy with flecainide. Pt will continue with Eliquis 5 mg bid. Stop ASA. F/u with cards in 2 weeks. Keep K >4 and Mg >2.0. may need outpatient BMP, Mg levels at outpatient cards appointment.

## 2023-04-19 NOTE — Progress Notes (Signed)
Mobility Specialist Progress Note:   04/19/23 0900  Mobility  Activity Ambulated independently in hallway  Level of Assistance Modified independent, requires aide device or extra time  Assistive Device None  Distance Ambulated (ft) 270 ft  Activity Response Tolerated well  Mobility Referral Yes  $Mobility charge 1 Mobility  Mobility Specialist Start Time (ACUTE ONLY) 0836  Mobility Specialist Stop Time (ACUTE ONLY) 0844  Mobility Specialist Time Calculation (min) (ACUTE ONLY) 8 min    Pre Mobility: 60 HR,  95% SpO2 During Mobility: 80 HR Post Mobility:  71 HR  Pt received in bed, agreeable to mobility. Reported usual dizziness from vertigo, but did not affect ambulation. No other complaints throughout. Pt left on EOB with call bell and all needs met. Family present.  D'Vante Earlene Plater Mobility Specialist Please contact via Special educational needs teacher or Rehab office at [phone]

## 2023-04-19 NOTE — Assessment & Plan Note (Signed)
Stable. Stop ASA since pt going to be on Eliquis for CVA prophylaxis for afib.

## 2023-04-19 NOTE — Progress Notes (Signed)
Rounding Note    Patient Name: Tracy Decker Date of Encounter: 04/19/2023  [facility] Cardiologist: Jodelle Red, MD   Subjective   Converted to sinus rhythm on her own yesterday around 5 pm and has remained in sinus. She feels better with less fluttering in her chest. About to get up and work with mobility specialist this morning.  Inpatient Medications    Scheduled Meds:  apixaban  5 mg Oral BID   busPIRone  5 mg Oral Daily   escitalopram  20 mg Oral Daily   irbesartan  300 mg Oral Daily   metoprolol succinate  50 mg Oral BID   pantoprazole  40 mg Oral Daily   rosuvastatin  40 mg Oral Daily   sodium chloride flush  3 mL Intravenous Q12H   Continuous Infusions:  PRN Meds: acetaminophen **OR** acetaminophen, senna-docusate   Vital Signs    Vitals:   04/18/23 2335 04/19/23 0328 04/19/23 0645 04/19/23 0800  BP: 133/82 (!) 148/94  (!) 150/83  Pulse: 66 60    Resp: 19 15    Temp: 98.4 F (36.9 C) 98.3 F (36.8 C)  98.4 F (36.9 C)  TempSrc: Oral Oral  Oral  SpO2: 97% 98%    Weight:   83.1 kg   Height:        Intake/Output Summary (Last 24 hours) at 04/19/2023 0917 Last data filed at 04/19/2023 0645 Gross per 24 hour  Intake 408.02 ml  Output 1345 ml  Net -936.98 ml      04/19/2023    6:45 AM 04/18/2023    5:50 AM 04/18/2023    2:06 AM  Last 3 Weights  Weight (lbs) 183 lb 3.2 oz 185 lb 13.6 oz 180 lb  Weight (kg) 83.1 kg 84.3 kg 81.647 kg      Telemetry    Conversion to SR 04/18/23 around 5 PM - Personally Reviewed  Physical Exam   GEN: No acute distress.   Neck: No JVD Cardiac: RRR, no murmurs, rubs, or gallops.  Respiratory: Clear to auscultation bilaterally. GI: Soft, nontender, non-distended  MS: No edema; No deformity. Neuro:  Nonfocal  Psych: Normal affect   New pertinent results (labs, ECG, imaging, cardiac studies)    Echo with EF 65-70%, moderate LVH, no significant valve disease  Patient Profile     57  y.o. female with PMH nonobstructive CAD, hypertension with LVH presenting with new atrial fibrillation with RVR.  Assessment & Plan    New paroxysmal atrial fibrillation -chadsvasc=3 -started DOAC, continue as an outpatient -spontaneously converted, TEE-CV cancelled for today -echo with preserved EF and no significant valve disease -discussed pill in a pocket strategy with flecainide. Ordered 300 mg flecainide to be taken daily PRN for afib. Should not take more than once in 24 hours -we discussed EP referral as an outpatient. 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 She has only had one episode that we know of, but she would be interested in ablation if/when she is a candidate -doing well on increased metoprolol dose, would continue this dose at discharge  Nonobstructive CAD -stop aspirin now that she is on DOAC -continue rosuvastatin  Hypertension LVH -restart home meds (valsartan, spironolactone, minoxidil, hydrochlorothiazide) at discharge -increased metoprolol dose as above  Ok for discharge from cards perspective. We will get her outpatient cardiology follow up. Communicated recommendations with Dr. Imogene Burn.    Signed, Jodelle Red, MD  04/19/2023, 9:17 AM

## 2023-04-19 NOTE — Assessment & Plan Note (Signed)
Resolved. Due to rapid afib.

## 2023-04-19 NOTE — Assessment & Plan Note (Signed)
Pt states she does not take her BP meds as she should. 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 She will need to discuss HTN management and other medication modifications to allow her to continue to work and take her meds. She may need to stop taking hydrochlorothiazide and replace this with alternative HTN meds. Will defer to PCP on best strategy for this.

## 2023-04-19 NOTE — Subjective & Objective (Signed)
Pt seen and examined. Mother at bedside Converted to NSR. TEE/DCCV canceled by cards.

## 2023-04-19 NOTE — Assessment & Plan Note (Signed)
Stable. On lexapro and buspar.

## 2023-04-19 NOTE — Assessment & Plan Note (Signed)
BMI 31.45

## 2023-05-09 ENCOUNTER — Other Ambulatory Visit (HOSPITAL_BASED_OUTPATIENT_CLINIC_OR_DEPARTMENT_OTHER): Payer: No Typology Code available for payment source

## 2023-05-09 ENCOUNTER — Encounter (HOSPITAL_BASED_OUTPATIENT_CLINIC_OR_DEPARTMENT_OTHER): Payer: Self-pay | Admitting: Family

## 2023-05-09 ENCOUNTER — Ambulatory Visit (HOSPITAL_BASED_OUTPATIENT_CLINIC_OR_DEPARTMENT_OTHER): Payer: No Typology Code available for payment source | Admitting: Family

## 2023-05-09 VITALS — BP 118/76 | HR 62 | Ht 64.0 in | Wt 192.4 lb

## 2023-05-09 DIAGNOSIS — I10 Essential (primary) hypertension: Secondary | ICD-10-CM

## 2023-05-09 DIAGNOSIS — I2510 Atherosclerotic heart disease of native coronary artery without angina pectoris: Secondary | ICD-10-CM | POA: Diagnosis not present

## 2023-05-09 DIAGNOSIS — I480 Paroxysmal atrial fibrillation: Secondary | ICD-10-CM

## 2023-05-09 DIAGNOSIS — E876 Hypokalemia: Secondary | ICD-10-CM

## 2023-05-09 DIAGNOSIS — D6859 Other primary thrombophilia: Secondary | ICD-10-CM

## 2023-05-09 DIAGNOSIS — E785 Hyperlipidemia, unspecified: Secondary | ICD-10-CM

## 2023-05-09 MED ORDER — ROSUVASTATIN CALCIUM 5 MG PO TABS: 5.0000 mg | ORAL_TABLET | Freq: Every day | ORAL | Status: AC

## 2023-05-09 MED ORDER — APIXABAN 5 MG PO TABS
5.0000 mg | ORAL_TABLET | Freq: Two times a day (BID) | ORAL | 1 refills | Status: AC
Start: 2023-05-09 — End: 2024-02-24

## 2023-05-09 MED ORDER — REPATHA SURECLICK 140 MG/ML ~~LOC~~ SOAJ: 140.0000 mg | SUBCUTANEOUS | 3 refills | Status: DC

## 2023-05-09 MED ORDER — REPATHA SURECLICK 140 MG/ML ~~LOC~~ SOAJ: 140.0000 mg | SUBCUTANEOUS | Status: AC

## 2023-05-09 MED ORDER — METOPROLOL SUCCINATE ER 50 MG PO TB24: 50.0000 mg | ORAL_TABLET | Freq: Every day | ORAL | 1 refills | Status: DC

## 2023-05-09 NOTE — Patient Instructions (Addendum)
Medication Instructions:   START Repatha injection every 14 days  CONTINUE Metoprolol Succinate 50mg  daily  CONTINUE Eliquis 5mg  twice daily  *If you need a refill on your cardiac medications before your next appointment, please call your pharmacy*   Lab Work: Your physician recommends that you return for lab work today: BMP, magnesium  Your physician recommends that you return for lab work in 3 months for fasting lipid panel, CMP  If you have labs (blood work) drawn today and your tests are completely normal, you will receive your results only by: MyChart Message (if you have MyChart) OR A paper copy in the mail If you have any lab test that is abnormal or we need to change your treatment, we will call you to review the results.   Testing/Procedures: Your physician has recommended that you wear a Zio monitor.   This monitor is a medical device that records the heart's electrical activity. Doctors most often use these monitors to diagnose arrhythmias. Arrhythmias are problems with the speed or rhythm of the heartbeat. The monitor is a small device applied to your chest. You can wear one while you do your normal daily activities. While wearing this monitor if you have any symptoms to push the button and record what you felt. Once you have worn this monitor for the period of time provider prescribed (Usually 14 days), you will return the monitor device in the postage paid box. Once it is returned they will download the data collected and provide [place] with a report which the provider will then review and we will call you with those results. Important tips:  Avoid showering during the first 24 hours of wearing the monitor. Avoid excessive sweating to help maximize wear time. Do not submerge the device, no hot tubs, and no swimming pools. Keep any lotions or oils away from the patch. After 24 hours you may shower with the patch on. Take brief showers with your back facing the shower head.   Do not remove patch once it has been placed because that will interrupt data and decrease adhesive wear time. Push the button when you have any symptoms and write down what you were feeling. Once you have completed wearing your monitor, remove and place into box which has postage paid and place in your outgoing mailbox.  If for some reason you have misplaced your box then call our office and we can provide another box and/or mail it off for you.     Follow-Up: At [facility], you and your health needs are our priority.  As part of our continuing mission to provide you with exceptional heart care, we have created designated Provider Care Teams.  These Care Teams include your primary Cardiologist (physician) and Advanced Practice Providers (APPs -  Physician Assistants and Nurse Practitioners) who all work together to provide you with the care you need, when you need it.  We recommend signing up for the patient portal called "MyChart".  Sign up information is provided on this After Visit Summary.  MyChart is used to connect with patients for Virtual Visits (Telemedicine).  Patients are able to view lab/test results, encounter notes, upcoming appointments, etc.  Non-urgent messages can be sent to your provider as well.   To learn more about what you can do with MyChart, go to [email or web address].    Your next appointment:   3 month(s)  Provider:   Jodelle Red, MD    Other Instructions  To prevent palpitations: Make sure you  are adequately hydrated.  Avoid and/or limit caffeine containing beverages like soda or tea. Exercise regularly.  Manage stress well. Some over the counter medications can cause palpitations such as Benadryl, AdvilPM, TylenolPM. Regular Advil or Tylenol do not cause palpitations.

## 2023-05-09 NOTE — Progress Notes (Signed)
Cardiology Office Note:  .   Date:  05/09/2023  ID:  [name], DOB 09/04/1965, MRN [id number] PCP: Truett Perna, MD  [facility] Providers Cardiologist:  Jodelle Red, MD    History of Present Illness: .   Tracy Decker is a 57 y.o. female with history of PAF, nonobstructive CAD, hypertension, hyperaldosteronism, bilateral adrenal hyperplasia/adenoma s/p left adenectomy 2013 and persistent right adrenal hyperplasia/adenoma, chronic vertigo, generalized anxiety disorder, hyperlipidemia, migraine, insomnia.  Admitted 10/28 - 04/19/2023 with new onset A-fib with RVR.  She self converted to NSR.  She was discharged on pill in pocket strategy of.  Flecainide, Eliquis 5 mg twice daily.  Discussed the use of AI scribe software for clinical note transcription with the patient, who gave verbal consent to proceed.    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 Presents with ongoing chest discomfort and fatigue following recent hospitalization. She describes the chest discomfort as a feeling of a thumbtack in her left chest and a fluttery sensation. Despite these symptoms, her KardiaMobile device consistently indicates a normal sinus rhythm. The patient also reports a lack of energy and occasional lightheadedness, which she attributes partly to her known vertigo. She also mentions a brief episode of arm numbness that resolved within minutes. The patient's symptoms occur both at rest and during activity, with no apparent exacerbation with movement.  In addition to these symptoms, the patient reports sleep disturbances, with difficulty both falling asleep and staying asleep.  She also reports a history of snoring but no daytime somnolence.  The patient is currently on multiple medications including rosuvastatin, metoprolol, valsartan, and spironolactone. She takes metoprolol and valsartan in the morning and spironolactone at night. Compliant with Eliquis 5mg  BID. She reports no  significant side effects from these medications, except for joint pain with higher doses of rosuvastatin. Presently taking Rosuvastatin 5mg  daily, med list updated.      Previous antihypertensive Lisinopril - Switch to Valsartan Amlodipine- gingival hypoplasia  ROS: Please see the history of present illness.    All other systems reviewed and are negative.   Studies Reviewed: .        Cardiac Studies & Procedures       ECHOCARDIOGRAM  ECHOCARDIOGRAM COMPLETE 04/18/2023  Narrative ECHOCARDIOGRAM REPORT    Patient Name:   Tracy Decker Date of Exam: 04/18/2023 Medical Rec #:  [id number]      Height:       64.0 in Accession #:    [id number]     Weight:       185.8 lb Date of Birth:  September 15, 1965      BSA:          1.896 m Patient Age:    57 years       BP:           133/77 mmHg Patient Gender: F              HR:           97 bpm. Exam Location:  Inpatient  Procedure: 2D Echo, Cardiac Doppler and Color Doppler  Indications:    Chest pain, Afib  History:        Patient has prior history of Echocardiogram examinations, most recent 07/15/2020. Arrythmias:Atrial Fibrillation; Risk Factors:Family History of Coronary Artery Disease.  Sonographer:    Karma Ganja Referring Phys: [id number] SUBRINA SUNDIL  IMPRESSIONS   1. Left ventricular ejection fraction, by estimation, is 65 to 70%. The left ventricle has normal function. The left ventricle  has no regional wall motion abnormalities. There is moderate left ventricular hypertrophy. Left ventricular diastolic parameters are indeterminate. 2. Right ventricular systolic function is normal. The right ventricular size is normal. There is normal pulmonary artery systolic pressure. 3. Left atrial size was mildly dilated. 4. There is no evidence of cardiac tamponade. 5. The mitral valve is normal in structure. Mild mitral valve regurgitation. 6. The aortic valve is normal in structure. Aortic valve regurgitation is not visualized. No  aortic stenosis is present.  FINDINGS Left Ventricle: Left ventricular ejection fraction, by estimation, is 65 to 70%. The left ventricle has normal function. The left ventricle has no regional wall motion abnormalities. The left ventricular internal cavity size was normal in size. There is moderate left ventricular hypertrophy. Left ventricular diastolic parameters are indeterminate.  Right Ventricle: The right ventricular size is normal. No increase in right ventricular wall thickness. Right ventricular systolic function is normal. There is normal pulmonary artery systolic pressure. The tricuspid regurgitant velocity is 2.65 m/s, and with an assumed right atrial pressure of 3 mmHg, the estimated right ventricular systolic pressure is 31.1 mmHg.  Left Atrium: Left atrial size was mildly dilated.  Right Atrium: Right atrial size was normal in size.  Pericardium: Trivial pericardial effusion is present. There is no evidence of cardiac tamponade.  Mitral Valve: The mitral valve is normal in structure. Mild mitral valve regurgitation.  Tricuspid Valve: The tricuspid valve is normal in structure. Tricuspid valve regurgitation is mild.  Aortic Valve: The aortic valve is normal in structure. Aortic valve regurgitation is not visualized. No aortic stenosis is present. Aortic valve mean gradient measures 6.0 mmHg. Aortic valve peak gradient measures 11.5 mmHg. Aortic valve area, by VTI measures 2.79 cm.  Pulmonic Valve: The pulmonic valve was grossly normal. Pulmonic valve regurgitation is trivial.  Aorta: The aortic root and ascending aorta are structurally normal, with no evidence of dilitation.  IAS/Shunts: No atrial level shunt detected by color flow Doppler.   LEFT VENTRICLE PLAX 2D LVIDd:         3.60 cm   Diastology LVIDs:         1.90 cm   LV e' medial:    6.72 cm/s LV PW:         1.40 cm   LV E/e' medial:  16.0 LV IVS:        1.50 cm   LV e' lateral:   9.24 cm/s LVOT diam:      1.80 cm   LV E/e' lateral: 11.6 LV SV:         77 LV SV Index:   41 LVOT Area:     2.54 cm   RIGHT VENTRICLE             IVC RV Basal diam:  3.40 cm     IVC diam: 1.50 cm RV S prime:     11.76 cm/s TAPSE (M-mode): 1.9 cm  LEFT ATRIUM             Index        RIGHT ATRIUM           Index LA diam:        4.50 cm 2.37 cm/m   RA Area:     13.90 cm LA Vol (A2C):   51.8 ml 27.32 ml/m  RA Volume:   37.00 ml  19.51 ml/m LA Vol (A4C):   68.0 ml 35.86 ml/m LA Biplane Vol: 59.7 ml 31.48 ml/m AORTIC VALVE AV Area (Vmax):  2.99 cm AV Area (Vmean):   2.97 cm AV Area (VTI):     2.79 cm AV Vmax:           169.33 cm/s AV Vmean:          113.667 cm/s AV VTI:            0.276 m AV Peak Grad:      11.5 mmHg AV Mean Grad:      6.0 mmHg LVOT Vmax:         199.00 cm/s LVOT Vmean:        132.567 cm/s LVOT VTI:          0.302 m LVOT/AV VTI ratio: 1.10  AORTA Ao Root diam: 2.40 cm Ao Asc diam:  2.90 cm  MITRAL VALVE                TRICUSPID VALVE MV Area (PHT): 3.27 cm     TR Peak grad:   28.1 mmHg MV Decel Time: 232 msec     TR Vmax:        265.00 cm/s MV E velocity: 107.25 cm/s SHUNTS Systemic VTI:  0.30 m Systemic Diam: 1.80 cm  Clearnce Hasten Electronically signed by Clearnce Hasten Signature Date/Time: 04/18/2023/2:35:23 PM    Final     CT SCANS  CT CORONARY MORPH W/CTA COR W/SCORE 07/16/2020  Addendum 07/17/2020  8:10 AM ADDENDUM REPORT: 07/17/2020 08:07  HISTORY: Chest pain/anginal equiv, ECGs or troponins abnormal  EXAM: Cardiac/Coronary CT  TECHNIQUE: The patient was scanned on a [employer].  PROTOCOL: A 120 kV prospective scan was triggered in the descending thoracic aorta at 111 HU's. Axial non-contrast 3 mm slices were carried out through the heart. The data set was analyzed on a dedicated work station and scored using the Agatson method. Gantry rotation speed was 250 msecs and collimation was 0.6 mm. Beta blockade and 0.8 mg of sl NTG  was given. The 3D data set was reconstructed in 5% intervals of 35-75% of the R-R cycle. Diastolic phases were analyzed on a dedicated work station using MPR, MIP and VRT modes. The patient received 80mL OMNIPAQUE IOHEXOL 350 MG/ML SOLN of contrast.  FINDINGS: Coronary calcium score: The patient's coronary artery calcium score is 3, which places the patient in the 83rd percentile.  Coronary arteries: Normal coronary origins.  Right dominance.  Right Coronary Artery: Normal caliber vessel, gives rise to PDA. No significant plaque or stenosis.  Left Main Coronary Artery: Normal caliber vessel. No significant plaque or stenosis.  Left Anterior Descending Coronary Artery: Normal caliber vessel. Very small area of noncalcified plaque at the takeoff of D1, 1-24% stenosis. Gives rise to 2 diagonal branches without significant plaque or stenosis. Distal LAD wraps apex.  Left Circumflex Artery: Normal caliber vessel. No significant plaque or stenosis. Gives rise to 2 large OM branches without significant plaque or stenosis.  Aorta: Normal size, mm at the mid ascending aorta (level of the PA bifurcation) measured double oblique. Trivial calcifications, consistent with aortic atherosclerosis. No dissection.  Aortic Valve: No calcifications. Trileaflet.  Other findings:  Normal pulmonary vein drainage into the left atrium.  Normal left atrial appendage without a thrombus.  Normal size of the pulmonary artery.  Likely small PFO, incidental finding.  IMPRESSION: 1.  Minimal nonobstructive CAD, CADRADS = 1.  2. Coronary calcium score of 3. This was 83rd percentile for age and sex matched control.  3. Normal coronary origin with right dominance.  4.  Incidental very  small PFO.  5.  Trivial aortic atherosclerosis.   Electronically Signed By: Jodelle Red M.D. On: 07/17/2020 08:07  Narrative EXAM: OVER-READ INTERPRETATION  CT CHEST  The following report is an  over-read performed by radiologist Dr. Jeronimo Greaves of [facility] on 07/16/2020. This over-read does not include interpretation of cardiac or coronary anatomy or pathology. The coronary CTA interpretation by the cardiologist is attached.  COMPARISON:  07/15/2020 CTA chest.  FINDINGS: Vascular: Aortic atherosclerosis. No central pulmonary embolism, on this non-dedicated study.  Mediastinum/Nodes: No imaged thoracic adenopathy.  Lungs/Pleura: Trace left pleural thickening. The right lower lobe pleural-based opacity is only partially imaged and was detailed on yesterday's exam.  Upper Abdomen: Normal imaged portions of the liver, spleen.  Musculoskeletal: No acute osseous abnormality.  IMPRESSION: 1.  No acute findings in the imaged extracardiac chest. 2.  Aortic Atherosclerosis (ICD10-I70.0).  Electronically Signed: By: Jeronimo Greaves M.D. On: 07/16/2020 13:23          Risk Assessment/Calculations:    CHA2DS2-VASc Score = 3   This indicates a 3.2% annual risk of stroke. The patient's score is based upon: CHF History: 0 HTN History: 1 Diabetes History: 0 Stroke History: 0 Vascular Disease History: 1 Age Score: 0 Gender Score: 1            Physical Exam:   VS:  BP 118/76   Pulse 62   Ht 5\' 4"  (1.626 m)   Wt 192 lb 6.4 oz (87.3 kg)   SpO2 94%   BMI 33.03 kg/m    Wt Readings from Last 3 Encounters:  05/09/23 192 lb 6.4 oz (87.3 kg)  04/19/23 183 lb 3.2 oz (83.1 kg)  01/13/22 170 lb (77.1 kg)    GEN: Well nourished, well developed in no acute distress NECK: No JVD; No carotid bruits CARDIAC: bradycardic, RRR, no murmurs, rubs, gallops RESPIRATORY:  Clear to auscultation without rales, wheezing or rhonchi  ABDOMEN: Soft, non-tender, non-distended EXTREMITIES:  No edema; No deformity   ASSESSMENT AND PLAN: .      Atrial Fibrillation / Hypercoagulable state 03/2023 admission with new onset PAF, self converted and discharged on Eliquis 5mg  BID and  PRN Flecainide. Reports of chest discomfort (describes as left sided thumbtack) and fluttering sensation despite KardiaMobile indicating normal sinus rhythm. EKG shows sinus bradycardia. Discussed potential for increased cardiac awareness post-atrial fibrillation and the option of ablation for a more permanent solution. -Refer to electrophysiology for consultation regarding ablation. -Initiate 2-week heart monitor to assess for atrial fibrillation episodes. -Continue Eliquis 5mg  BID. CHA2DS2-VASc Score = 3 [CHF History: 0, HTN History: 1, Diabetes History: 0, Stroke History: 0, Vascular Disease History: 1, Age Score: 0, Gender Score: 1].  Therefore, the patient's annual risk of stroke is 3.2 %.     -Continue Toprol 50mg  daily (of note was prescribed BID at discharge but has been taking daily with heart rates 50-60s, continue daily dosing)  Hyperlipidemia, LDL goal <70 LDL still elevated (149) despite rosuvastatin 5mg  QDdue to intolerance of higher dose. Discussed options of adding Nexlizet or Repatha. -Start Repatha, recheck cholesterol levels after 6 doses (12 weeks).   Hypertension Blood pressure on the lower end (118/76), which may contribute to feelings of lightheadedness and lack of energy. Patient is on valsartan and spironolactone. Of note, Spironolactone will likely need continued due to prior adrenal issues. No longer taking HCTZ and BP at goal, will remove from med list. -Advise patient to monitor blood pressure regularly. If consistently less than 120,  consider reducing antihypertensive medication.  Electrolyte Abnormalities Discussed the interplay between magnesium and potassium in the context of atrial fibrillation. -Check potassium and magnesium levels today.  Sleep Disturbances Reports difficulty falling asleep and staying asleep. Discussed potential link between sleep apnea and atrial fibrillation, but patient does not exhibit typical signs of sleep apnea as no daytime somnolence.  Does have Rx for PRN Ambien by outside provider. -No immediate intervention, continue to monitor.             Dispo: follow up in 3 mos  Signed, Alver Sorrow, NP

## 2023-05-10 ENCOUNTER — Telehealth (HOSPITAL_BASED_OUTPATIENT_CLINIC_OR_DEPARTMENT_OTHER): Payer: Self-pay

## 2023-05-10 LAB — BASIC METABOLIC PANEL
BUN/Creatinine Ratio: 14 (ref 9–23)
BUN: 12 mg/dL (ref 6–24)
CO2: 21 mmol/L (ref 20–29)
Calcium: 9.7 mg/dL (ref 8.7–10.2)
Chloride: 102 mmol/L (ref 96–106)
Creatinine, Ser: 0.83 mg/dL (ref 0.57–1.00)
Glucose: 83 mg/dL (ref 70–99)
Potassium: 4.6 mmol/L (ref 3.5–5.2)
Sodium: 138 mmol/L (ref 134–144)
eGFR: 83 mL/min/{1.73_m2} (ref >59)

## 2023-05-10 LAB — MAGNESIUM: Magnesium: 1.8 mg/dL (ref 1.6–2.3)

## 2023-05-10 MED ORDER — POTASSIUM CHLORIDE 20 MEQ PO PACK: 20.0000 meq | PACK | Freq: Every day | ORAL | 0 refills | Status: AC

## 2023-05-10 NOTE — Telephone Encounter (Signed)
-----   Message from Alver Sorrow sent at 05/10/2023  7:52 AM EST ----- Normal kidney function and electrolytes including potassium, magnesium.  Continue potassium 20 mEq packet daily and magnesium oxide 400 mg twice daily.  Please provide refills.

## 2023-06-17 ENCOUNTER — Ambulatory Visit: Payer: No Typology Code available for payment source | Attending: Cardiology | Admitting: Cardiology

## 2023-06-17 ENCOUNTER — Encounter: Payer: Self-pay | Admitting: Cardiology

## 2023-06-17 VITALS — BP 142/84 | HR 73 | Ht 64.0 in | Wt 192.8 lb

## 2023-06-17 DIAGNOSIS — I10 Essential (primary) hypertension: Secondary | ICD-10-CM

## 2023-06-17 DIAGNOSIS — I480 Paroxysmal atrial fibrillation: Secondary | ICD-10-CM

## 2023-06-17 DIAGNOSIS — D6869 Other thrombophilia: Secondary | ICD-10-CM | POA: Diagnosis not present

## 2023-06-17 MED ORDER — FLECAINIDE ACETATE 50 MG PO TABS: 50.0000 mg | ORAL_TABLET | Freq: Two times a day (BID) | ORAL | 3 refills | Status: AC

## 2023-06-17 NOTE — Progress Notes (Signed)
Electrophysiology Office Note:   Date:  06/17/2023  ID:  Tracy Decker, DOB [birth year], MRN [id number]  Primary Cardiologist: Jodelle Red, MD Primary Heart Failure: None Electrophysiologist: Nobie Putnam, MD      History of Present Illness:   Tracy Decker is a 57 y.o. female with h/o nonobstructive CAD, hypertension, hyperaldosteronism, bilateral adrenal hyperplasia/adenoma s/p left adenectomy 2013 and persistent right adrenal hyperplasia/adenoma, chronic vertigo, generalized anxiety disorder, hyperlipidemia, migraine, insomnia  seen today for evaluation for her atrial fibrillation at the request of Gillian Shields, NP.   Patient was admitted 10/28-10/29 for new onset atrial fibrillation with RVR after presenting to the ED with palpitations.  She converted spontaneously to sinus rhythm.  She was discharged on flecainide as a pill in the pocket and Eliquis.  Since discharge, she reports that her Apple Watch has continued to alert her of atrial fibrillation episodes, estimating daily burden of 2%.  She has not had any sustained episodes like she had in October.  She has not needed to take the flecainide.  She is otherwise doing relatively well.  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  She denies any chest pain, shortness of breath, palpitations and significant lower extremity edema.  She does have some dizziness which is chronic and she attributes to vertigo.  Review of systems complete and found to be negative unless listed in HPI.   EP Information / Studies Reviewed:    EKG is not ordered today. EKG from 04/18/23 reviewed which showed atrial fibrillation.     Echo 04/18/23: LV size and function.  LVEF 65 to 70%. Normal RV size and function. Mildly dilated left atrium.  Normal size right atrium. No significant valvular disease.  07/16/20 Coronary CTA:  IMPRESSION: 1.  Minimal nonobstructive CAD, CADRADS = 1. 2. Coronary calcium score of 3. This was 83rd percentile for  age and sex matched control. 3. Normal coronary origin with right dominance. 4.  Incidental very small PFO. 5.  Trivial aortic atherosclerosis.  Risk Assessment/Calculations:    CHA2DS2-VASc Score = 3   This indicates a 3.2% annual risk of stroke. The patient's score is based upon: CHF History: 0 HTN History: 1 Diabetes History: 0 Stroke History: 0 Vascular Disease History: 1 Age Score: 0 Gender Score: 1     Physical Exam:   VS:  BP (!) 142/84   Pulse 73   Ht 5\' 4"  (1.626 m)   Wt 192 lb 12.8 oz (87.5 kg)   SpO2 98%   BMI 33.09 kg/m    Wt Readings from Last 3 Encounters:  06/17/23 192 lb 12.8 oz (87.5 kg)  05/09/23 192 lb 6.4 oz (87.3 kg)  04/19/23 183 lb 3.2 oz (83.1 kg)     GEN: Well nourished, well developed in no acute distress NECK: No JVD CARDIAC: Normal rate, regular rhythm RESPIRATORY:  Clear to auscultation without rales, wheezing or rhonchi  ABDOMEN: Soft, non-tender, non-distended EXTREMITIES:  No edema; No deformity   ASSESSMENT AND PLAN:   Tracy Decker is a 57 y.o. female with h/o nonobstructive CAD, hypertension, hyperaldosteronism, bilateral adrenal hyperplasia/adenoma s/p left adenectomy 2013 and persistent right adrenal hyperplasia/adenoma, chronic vertigo, generalized anxiety disorder, hyperlipidemia, migraine, insomnia  seen today for evaluation for her atrial fibrillation at the request of Gillian Shields, NP.   #Paroxysmal atrial fibrillation, symptomatic: She had 1 sustained episode that we know of back in October. - Continue Eliquis.  - Given that her Apple Watch continues to alert her of atrial fibrillation with an  estimated burden of 2% and she had bursts of what is likely pulmonary vein AT on her Zio, we will start flecainide 50 mg twice daily.  We can increase to 100 mg twice daily if she has breakthrough.  She will get a exercise ECG test to assess for QRS widening on flecainide in 1 week. - Idea of ablation was introduced that we agreed  given that her diagnosis is a new and overall burden appears to be relatively low that we will try medications first. - Continue metoprolol.   #Secondary hypercoagulable state due to atrial fibrillation:  - CHADSVASC score of 3.  - Continue Eliquis.  #Hypertension - Above goal today.  Recommend checking blood pressures 1-2 times per week at home and recording the values.  Recommend bringing these recordings to the primary care physician.  Follow up with Dr. Jimmey Ralph in 3 months  Total time of encounter: 65 minutes total time of encounter, including chart review, face-to-face patient care, coordination of care and counseling regarding high complexity medical decision making.  Signed, Nobie Putnam, MD

## 2023-06-17 NOTE — Patient Instructions (Signed)
Medication Instructions:  Your physician has recommended you make the following change in your medication:  1) START taking flecainide 50 mg twice daily *If you need a refill on your cardiac medications before your next appointment, please call your pharmacy*  Testing/Procedures: IN ONE WEEK: Your physician has requested that you have an exercise tolerance test. For further information please visit [email or web address] Please also follow instruction sheet, as given  Follow-Up: At [facility], you and your health needs are our priority.  As part of our continuing mission to provide you with exceptional heart care, we have created designated Provider Care Teams.  These Care Teams include your primary Cardiologist (physician) and Advanced Practice Providers (APPs -  Physician Assistants and Nurse Practitioners) who all work together to provide you with the care you need, when you need it.  Your next appointment:   3 months  Provider:   You may see Nobie Putnam, MD or one of the following Advanced Practice Providers on your designated Care Team:   Francis Dowse, [place] [address]" [place], [place] Sherie Don, NP Canary Brim, NP

## 2023-06-27 ENCOUNTER — Other Ambulatory Visit: Payer: Self-pay

## 2023-06-27 DIAGNOSIS — I480 Paroxysmal atrial fibrillation: Secondary | ICD-10-CM

## 2023-06-28 ENCOUNTER — Ambulatory Visit: Payer: No Typology Code available for payment source | Attending: Cardiology

## 2023-06-28 DIAGNOSIS — I480 Paroxysmal atrial fibrillation: Secondary | ICD-10-CM

## 2023-06-28 LAB — EXERCISE TOLERANCE TEST
Angina Index: 0
Duke Treadmill Score: 8
Estimated workload: 9.3
Exercise duration (min): 7 min
Exercise duration (sec): 31 s
MPHR: 164 {beats}/min
Peak HR: 146 {beats}/min
Percent HR: 89 %
RPE: 18
Rest HR: 68 {beats}/min
ST Depression (mm): 0 mm

## 2023-08-19 ENCOUNTER — Ambulatory Visit (HOSPITAL_BASED_OUTPATIENT_CLINIC_OR_DEPARTMENT_OTHER): Payer: No Typology Code available for payment source | Admitting: Cardiology

## 2023-08-19 ENCOUNTER — Encounter (HOSPITAL_BASED_OUTPATIENT_CLINIC_OR_DEPARTMENT_OTHER): Payer: Self-pay | Admitting: Cardiology

## 2023-08-19 VITALS — BP 152/88 | HR 75 | Ht 64.0 in

## 2023-08-19 DIAGNOSIS — I480 Paroxysmal atrial fibrillation: Secondary | ICD-10-CM

## 2023-08-19 DIAGNOSIS — Z7901 Long term (current) use of anticoagulants: Secondary | ICD-10-CM

## 2023-08-19 DIAGNOSIS — I10 Essential (primary) hypertension: Secondary | ICD-10-CM | POA: Diagnosis not present

## 2023-08-19 DIAGNOSIS — Z7189 Other specified counseling: Secondary | ICD-10-CM

## 2023-08-19 DIAGNOSIS — D6869 Other thrombophilia: Secondary | ICD-10-CM

## 2023-08-19 DIAGNOSIS — G720 Drug-induced myopathy: Secondary | ICD-10-CM

## 2023-08-19 DIAGNOSIS — I2510 Atherosclerotic heart disease of native coronary artery without angina pectoris: Secondary | ICD-10-CM

## 2023-08-19 DIAGNOSIS — T466X5A Adverse effect of antihyperlipidemic and antiarteriosclerotic drugs, initial encounter: Secondary | ICD-10-CM

## 2023-08-19 DIAGNOSIS — E7800 Pure hypercholesterolemia, unspecified: Secondary | ICD-10-CM

## 2023-08-19 MED ORDER — CHLORTHALIDONE 25 MG PO TABS: 25.0000 mg | ORAL_TABLET | Freq: Every day | ORAL | 3 refills | Status: AC

## 2023-08-19 NOTE — Progress Notes (Signed)
 Cardiology Office Note:  .   Date:  08/19/2023  ID:  Tracy Decker, DOB 1966-02-02, MRN [id number] PCP: Truett Perna, MD  [facility] Providers Cardiologist:  Jodelle Red, MD Electrophysiologist:  Nobie Putnam, MD {  History of Present Illness: .   Tracy Decker is a 58 y.o. female with a hx of paroxysmal atrial fibrillation, hypertension, prior adrenal issues, obesity, personal history of Covid who is seen for follow up. I met her in the hospital 06/2020.   Pertinent CV history: Hospitalization from 06/2020, noted to have mild, largely flat elevation in hsTnI (104, 122, 153). CT cardiac with Ca score of 3 and minimal nonobstructive CAD. Echo hyperdynamic, LVH noted. CRP/ESR normal for age. Hospitalized 03/2023, found to be in new afib.  Today: Last seen by Dr. Jimmey Ralph 05/2023, started on flecainide at that time. Today she notes occasional swelling in her right ankle, left has been ok. Started after she started daily flecainide. She has been watching her sodium.  Still in afib about 5% of the time based on her watch, even with flecainide daily dosing. Her ETT last month was normal. She never feels as bad as she did in October, and longest episode has been 30 minutes. More commonly it is a brief fluttering sensation.   Asked great questions about DOAC and and repatha, discussed at length day.  Struggles with vertigo, meclizine did not help.  Checks BP at home, has been running higher at home. No clear triggers for this. Was on hydrochlorothiazide in the past. We discussed chlorthalidone today.  ROS: Denies chest pain, shortness of breath at rest or with normal exertion. No PND, orthopnea, or unexpected weight gain. No syncope or palpitations. ROS otherwise negative except as noted.   Studies Reviewed: Marland Kitchen    EKG:       Physical Exam:   VS:  BP (!) 136/96   Pulse 75   Ht 5\' 4"  (1.626 m)   SpO2 98%   BMI 33.09 kg/m    Wt Readings from Last 3 Encounters:  06/17/23 192  lb 12.8 oz (87.5 kg)  05/09/23 192 lb 6.4 oz (87.3 kg)  04/19/23 183 lb 3.2 oz (83.1 kg)    GEN: Well nourished, well developed in no acute distress HEENT: Normal, moist mucous membranes NECK: No JVD CARDIAC: regular rhythm, normal S1 and S2, no rubs or gallops. No murmur. VASCULAR: Radial and DP pulses 2+ bilaterally. No carotid bruits RESPIRATORY:  Clear to auscultation without rales, wheezing or rhonchi  ABDOMEN: Soft, non-tender, non-distended MUSCULOSKELETAL:  Ambulates independently SKIN: Warm and dry, no edema. Prominent ankle bursa on R side. NEUROLOGIC:  Alert and oriented x 3. No focal neuro deficits noted. PSYCHIATRIC:  Normal affect    ASSESSMENT AND PLAN: .    paroxysmal atrial fibrillation -chadsvasc=3, secondary hypercoagulable state -echo with preserved EF and no significant valve disease -has seen Dr. Jimmey Ralph, started on flecainide 50 mg BID. -planned to manage medically first, would be considered for ablation if medical management not successful -continue apixaban 5 mg BID  Nonobstructive CAD, based on Coronary CT Hypercholesterolemia, LDL goal <70 -stopped aspirin with DOAC -continue rosuvastatin 5 mg daily, didn't tolerate higher doses due to myalgia, wasn't at goal -now on PCSK9i and doing well. Discussed future options for therapies   Hypertension LVH -elevated BP today and per report, recent home numbers high as well -continue minoxidil 2.5 mg daily, valsartan 320 mg daily, spironolactone 50 mg daily, metoprolol 50 mg daily -starting chlorthalidone, recheck bmet  in 2-3 weeks. Check BP at home -already watching sodium  CV risk counseling and prevention -recommend heart healthy/Mediterranean diet, with whole grains, fruits, vegetable, fish, lean meats, nuts, and olive oil. Limit salt. -recommend moderate walking, 3-5 times/week for 30-50 minutes each session. Aim for at least 150 minutes.week. Goal should be pace of 3 miles/hours, or walking 1.5 miles in  30 minutes -recommend avoidance of tobacco products. Avoid excess alcohol.  Dispo: 6 weeks for nurse visit, provider visit in 3 mos  Signed, Jodelle Red, MD   Jodelle Red, MD, PhD, [facility] [place]  [facility]  [place]  [facility] at [facility] [address] [place], [place] [address] [phone]

## 2023-08-19 NOTE — Patient Instructions (Signed)
 Medication Instructions:  Your physician has recommended you make the following change in your medication:  START Chlorthalidone 25 mg daily   *If you need a refill on your cardiac medications before your next appointment, please call your pharmacy*  Lab Work: Your physician recommends that you return for lab work in 3 weeks: BMP  If you have labs (blood work) drawn today and your tests are completely normal, you will receive your results only by: MyChart Message (if you have MyChart) OR A paper copy in the mail If you have any lab test that is abnormal or we need to change your treatment, we will call you to review the results.  Follow-Up: At [facility], you and your health needs are our priority.  As part of our continuing mission to provide you with exceptional heart care, we have created designated Provider Care Teams.  These Care Teams include your primary Cardiologist (physician) and Advanced Practice Providers (APPs -  Physician Assistants and Nurse Practitioners) who all work together to provide you with the care you need, when you need it.  We recommend signing up for the patient portal called "MyChart".  Sign up information is provided on this After Visit Summary.  MyChart is used to connect with patients for Virtual Visits (Telemedicine).  Patients are able to view lab/test results, encounter notes, upcoming appointments, etc.  Non-urgent messages can be sent to your provider as well.   To learn more about what you can do with MyChart, go to [email or web address].    Your next appointment:   Follow up in 6 weeks for nurse visit Follow up in 3 months with Dr. Cristal Deer or Gillian Shields, NP

## 2023-09-15 ENCOUNTER — Ambulatory Visit: Payer: No Typology Code available for payment source | Admitting: Student

## 2023-09-15 ENCOUNTER — Ambulatory Visit: Payer: No Typology Code available for payment source | Admitting: Pulmonary Disease

## 2023-09-30 ENCOUNTER — Ambulatory Visit (HOSPITAL_BASED_OUTPATIENT_CLINIC_OR_DEPARTMENT_OTHER): Payer: No Typology Code available for payment source

## 2023-09-30 ENCOUNTER — Ambulatory Visit: Admitting: Obstetrics and Gynecology

## 2023-09-30 VITALS — BP 132/91 | HR 78 | Ht 65.0 in | Wt 193.0 lb

## 2023-09-30 DIAGNOSIS — N951 Menopausal and female climacteric states: Secondary | ICD-10-CM | POA: Diagnosis not present

## 2023-09-30 DIAGNOSIS — Z30431 Encounter for routine checking of intrauterine contraceptive device: Secondary | ICD-10-CM

## 2023-09-30 DIAGNOSIS — Z975 Presence of (intrauterine) contraceptive device: Secondary | ICD-10-CM

## 2023-09-30 NOTE — Progress Notes (Signed)
   ESTABLISHED GYNECOLOGY VISIT Chief Complaint  Patient presents with   Gynecologic Exam    Needs IUD stings clipped    Subjective:  Tracy Decker is a 58 y.o. G1P1001 presenting for IUD string check.  Reports partner can feel her strings. Requesting trim. States she can feel them when she places a finger in her vagina and they feel long.  Reports she is still having periods. Mother went through menopause age 86. Reports periods as monthly and light.    Review of Systems:   Pertinent items are noted in HPI  Pertinent History Reviewed:  Reviewed past medical,surgical, social and family history.  Reviewed problem list, medications and allergies.  Objective:   Vitals:   09/30/23 0812 09/30/23 0816  BP: (!) 141/92 (!) 132/91  Pulse: 78   Weight: 193 lb (87.5 kg)   Height: 5\' 5"  (1.651 m)    Physical Examination:   General appearance - well appearing, and in no distress  Mental status - alert, oriented to person, place, and time  Psych:  normal mood and affect  Skin - warm and dry, normal color, no suspicious lesions noted  Pelvic -  VULVA: normal appearing vulva with no masses, tenderness or lesions   VAGINA: normal appearing vagina with normal color and discharge, no lesions   CERVIX: normal appearing cervix without discharge or lesions, IUD strings seen, trimmed  Chaperone present for exam  Assessment and Plan:  Tracy Decker is a 58 y.o. with IUD string trim  1. IUD (intrauterine device) in place (Primary) Strings trimmed  2. Perimenopause Recommend assessment of menopausal status given that she is 58 and still having periods. If menopausal, consider ultrasound vs EMB to assess abnormal bleeding - Follicle stimulating hormone - Estradiol   No follow-ups on file.  Future Appointments  Date Time Provider Department Center  11/28/2023  8:00 AM Alver Sorrow, NP [facility]    Wanita Chamberlain, MD, FACOG Obstetrician & Gynecologist, [facility] for [facility], [facility]

## 2023-10-01 LAB — FOLLICLE STIMULATING HORMONE: FSH: 19 m[IU]/mL

## 2023-10-01 LAB — ESTRADIOL: Estradiol: 5.5 pg/mL

## 2023-10-06 ENCOUNTER — Encounter: Payer: Self-pay | Admitting: Obstetrics and Gynecology

## 2023-11-26 LAB — BASIC METABOLIC PANEL WITH GFR
BUN/Creatinine Ratio: 14 (ref 9–23)
BUN: 11 mg/dL (ref 6–24)
CO2: 20 mmol/L (ref 20–29)
Calcium: 8.1 mg/dL — ABNORMAL LOW (ref 8.7–10.2)
Chloride: 104 mmol/L (ref 96–106)
Creatinine, Ser: 0.77 mg/dL (ref 0.57–1.00)
Glucose: 88 mg/dL (ref 70–99)
Potassium: 4.8 mmol/L (ref 3.5–5.2)
Sodium: 138 mmol/L (ref 134–144)
eGFR: 90 mL/min/{1.73_m2} (ref >59)

## 2023-11-28 ENCOUNTER — Ambulatory Visit (HOSPITAL_BASED_OUTPATIENT_CLINIC_OR_DEPARTMENT_OTHER): Payer: No Typology Code available for payment source | Admitting: Family

## 2023-12-01 ENCOUNTER — Ambulatory Visit (HOSPITAL_BASED_OUTPATIENT_CLINIC_OR_DEPARTMENT_OTHER): Payer: Self-pay | Admitting: Cardiology

## 2023-12-09 ENCOUNTER — Encounter: Payer: Self-pay | Admitting: Emergency Medicine

## 2023-12-09 ENCOUNTER — Ambulatory Visit: Attending: Emergency Medicine | Admitting: Emergency Medicine

## 2023-12-09 VITALS — BP 152/94 | HR 68 | Ht 64.0 in | Wt 195.0 lb

## 2023-12-09 DIAGNOSIS — I2510 Atherosclerotic heart disease of native coronary artery without angina pectoris: Secondary | ICD-10-CM | POA: Diagnosis not present

## 2023-12-09 DIAGNOSIS — I10 Essential (primary) hypertension: Secondary | ICD-10-CM

## 2023-12-09 DIAGNOSIS — E785 Hyperlipidemia, unspecified: Secondary | ICD-10-CM | POA: Diagnosis not present

## 2023-12-09 DIAGNOSIS — I480 Paroxysmal atrial fibrillation: Secondary | ICD-10-CM

## 2023-12-09 MED ORDER — CARVEDILOL 12.5 MG PO TABS
12.5000 mg | ORAL_TABLET | Freq: Two times a day (BID) | ORAL | 3 refills | Status: AC
Start: 2023-12-09 — End: 2024-03-08

## 2023-12-09 NOTE — Progress Notes (Unsigned)
 Cardiology Office Note:    Date:  12/09/2023  ID:  Tracy Decker, DOB 04-Mar-1966, MRN [id number] PCP: Cesario Mutton, MD  [facility] Providers Cardiologist:  Shelda Bruckner, MD Electrophysiologist:  Fonda Kitty, MD { Click to update primary MD,subspecialty MD or APP then REFRESH:1}    {Click to Open Review  :1}   Patient Profile:       Chief Complaint: *** History of Present Illness:  Tracy Decker is a 58 y.o. female with visit-pertinent history of proximal atrial fibrillation, nonobstructive CAD, hypertension, hyper aldosteronism, bilateral adrenal hyperplasia/adenoma s/p left adenectomy 2013 and persistent right adrenal hyperplasia/adenoma, chronic vertigo, generalized anxiety disorder, hyperlipidemia, migraine, insomnia  She was admitted 10/28 through 04/19/2023 with new onset atrial fibrillation with RVR.  She self converted to NSR.  She was discharged on flecainide  and Eliquis  5 mg twice daily.  She underwent ETT on 06/28/2023 showing no ST deviations and no significant arrhythmias with stress.  Was seen by Dr. Kitty on 05/2023.  Plan was to manage medically first for atrial fibrillation, would be considered for ablation if medical management was successful  She was last seen in office on 08/11/2023 by Dr. Bruckner.  Her blood pressure was elevated 136/96.  She was started on chlorthalidone .  She was continued on minoxidil, valsartan , spironolactone , metoprolol .  She was to follow-up in 3 months.   Discussed the use of AI scribe software for clinical note transcription with the patient, who gave verbal consent to proceed.  History of Present Illness Tracy Decker is a 58 year old female with hypertension and atrial fibrillation who presents with elevated blood pressure.  She experiences persistent elevated blood pressure despite taking multiple antihypertensive medications, including chlorthalidone , metoprolol , minoxidil, spironolactone , and valsartan . Home blood  pressure readings average 140/82 mmHg, with slight variations. A low sodium diet has not significantly impacted her blood pressure.  She has family history of hypertension.  Despite a 40-pound weight loss two years ago, her blood pressure remains high.  Her heart feels stable, and she no longer experiences palpitations or irregular heartbeats. There have been no recent episodes of atrial fibrillation.  She denies any chest pain, dyspnea, orthopnea, PND, syncope, presyncope, lightheadedness, dizziness.  Review of systems:  Please see the history of present illness. All other systems are reviewed and otherwise negative.      Studies Reviewed:    EKG Interpretation Date/Time:  Friday December 09 2023 08:42:09 EDT Ventricular Rate:  66 PR Interval:  172 QRS Duration:  96 QT Interval:  434 QTC Calculation: 454 R Axis:   -25  Text Interpretation: Normal sinus rhythm Possible Left atrial enlargement Left ventricular hypertrophy ( R in aVL , Cornell product ) T wave abnormality, consider lateral ischemia When compared with ECG of 09-May-2023 14:14, No significant change was found Confirmed by Rana Dixon [phone]) on 12/09/2023 1:08:32 PM   Exercise tolerance test 06/28/2023   No ST deviation was noted.   Normal ETT   HTN response to exercise   No significant arrhythmias with stress  ZIO 05/09/2023 Patch Wear Time:  13 days and 23 hours (2024-11-18T15:01:31-0500 to 2024-12-02T15:01:23-0500)   Patient had a min HR of 44 bpm, max HR of 145 bpm, and avg HR of 66 bpm. Predominant underlying rhythm was Sinus Rhythm. 1 run of nonsustained Ventricular Tachycardia occurred lasting 6 beats with a max rate of 130 bpm (avg 116 bpm). 3 Supraventricular Tachycardia runs occurred, the run with the fastest interval lasting 4 beats with a max rate of 145  bpm, the longest lasting 8 beats with an avg rate of 113 bpm. No atrial fibrillation, high degree block, or pauses noted. Isolated atrial and ventricular  ectopy was rare (<1%). There were 37 patient triggered events. Most of these were normal sinus rhythm. Three events were sinus with single ectopic beats, one episode was associated with the 6 beats of NSVT noted above, and one was associated with possible junctional rhythm (rate 53 bpm).  Echocardiogram 04/18/2023 1. Left ventricular ejection fraction, by estimation, is 65 to 70%. The  left ventricle has normal function. The left ventricle has no regional  wall motion abnormalities. There is moderate left ventricular hypertrophy.  Left ventricular diastolic  parameters are indeterminate.   2. Right ventricular systolic function is normal. The right ventricular  size is normal. There is normal pulmonary artery systolic pressure.   3. Left atrial size was mildly dilated.   4. There is no evidence of cardiac tamponade.   5. The mitral valve is normal in structure. Mild mitral valve  regurgitation.   6. The aortic valve is normal in structure. Aortic valve regurgitation is  not visualized. No aortic stenosis is present.  Risk Assessment/Calculations:    CHA2DS2-VASc Score = 3  {Confirm score is correct.  If not, click here to update score.  REFRESH note.  :1} This indicates a 3.2% annual risk of stroke. The patient's score is based upon: CHF History: 0 HTN History: 1 Diabetes History: 0 Stroke History: 0 Vascular Disease History: 1 Age Score: 0 Gender Score: 1   {This patient has a significant risk of stroke if diagnosed with atrial fibrillation.  Please consider VKA or DOAC agent for anticoagulation if the bleeding risk is acceptable.   You can also use the SmartPhrase .[email or web address] for documentation.   :[id number]} HYPERTENSION CONTROL Vitals:   12/09/23 0837 12/09/23 1317  BP: (!) 158/96 (!) 152/94    The patient's blood pressure is elevated above target today. {Click here if intervention needs to be changed Refresh Note :1}  In order to address the patient's elevated BP: A new  medication was prescribed today.; A current anti-hypertensive medication was adjusted today.      STOP-Bang Score:  3  { Consider Dx Sleep Disordered Breathing or Sleep Apnea  ICD G47.33          :1}     Physical Exam:   VS:  BP (!) 152/94 (BP Location: Right Arm, Patient Position: Sitting, Cuff Size: Normal)   Pulse 68   Ht 5' 4 (1.626 m)   Wt 195 lb (88.5 kg)   SpO2 96%   BMI 33.47 kg/m    Wt Readings from Last 3 Encounters:  12/09/23 195 lb (88.5 kg)  09/30/23 193 lb (87.5 kg)  06/17/23 192 lb 12.8 oz (87.5 kg)    GEN: Well nourished, well developed in no acute distress NECK: No JVD; No carotid bruits CARDIAC: RRR, no murmurs, rubs, gallops RESPIRATORY:  Clear to auscultation without rales, wheezing or rhonchi  ABDOMEN: Soft, non-tender, non-distended EXTREMITIES:  No edema; No acute deformity      Assessment and Plan:  Hypertension Blood pressure today is 150/96 and repeat 152/94 Home blood pressure readings have been > 140 systolic - Plan to discontinue metoprolol  XL and start carvedilol  12.5 mg twice daily - Continue chlorthalidone  25 mg daily, minoxidil 2.5 mg daily, spironolactone  50 mg daily, valsartan  320 mg daily - Continue home BP log and monitoring  Paroxysmal atrial fibrillation Dx 10/28 - 10/29 in A-fib  with RVR after presenting to ED with palpitations.  She converted spontaneously to sinus rhythm. - Plan is to manage medically first, would be considered for ablation if medical management not successful  - Today she denies any symptoms concerning for recurrent atrial fibrillation.  She is currently monitoring on her Apple Watch without any reoccurrence -  As noted above discontinue metoprolol  and start carvedilol  25 mg twice daily - Continue Eliquis  5 mg twice daily Assessment & Plan Hypertension Hypertension remains uncontrolled. Home readings average 140/82 mmHg. Discussed switching metoprolol  to carvedilol  for better control. She prefers not to increase  pill burden. - Discontinue metoprolol . - Initiate carvedilol  12.5 mg twice daily.  Atrial fibrillation Atrial fibrillation is well-controlled. Switching to carvedilol  will maintain control while improving blood pressure management. - Switch from metoprolol  to carvedilol  12.5 mg twice daily.  Coronary artery disease  Asthma No acute issues reported.  Irritable bowel syndrome (IBS) No acute issues reported.     {Are you ordering a CV Procedure (e.g. stress test, cath, DCCV, TEE, etc)?   Press F2        :[id number]}  Dispo:  Return in about 6 weeks (around 01/20/2024).  Signed, Lum LITTIE Louis, NP

## 2023-12-09 NOTE — Patient Instructions (Signed)
 Medication Instructions:  STOP TAKING METOPROLOL .   START TAKING CARVEDILOL 12.5 MG TWICE DAILY.  Lab Work: NONE   Testing/Procedures: NONE  Follow-Up: At [facility], you and your health needs are our priority.  As part of our continuing mission to provide you with exceptional heart care, our providers are all part of one team.  This team includes your primary Cardiologist (physician) and Advanced Practice Providers or APPs (Physician Assistants and Nurse Practitioners) who all work together to provide you with the care you need, when you need it.  Your next appointment:   6 WEEKS.  Provider:   Neomi Banks, NP

## 2023-12-12 ENCOUNTER — Encounter: Payer: Self-pay | Admitting: Emergency Medicine

## 2024-01-13 ENCOUNTER — Ambulatory Visit (HOSPITAL_BASED_OUTPATIENT_CLINIC_OR_DEPARTMENT_OTHER): Admitting: Family

## 2024-02-24 ENCOUNTER — Ambulatory Visit (HOSPITAL_BASED_OUTPATIENT_CLINIC_OR_DEPARTMENT_OTHER): Admitting: Family

## 2024-02-24 ENCOUNTER — Encounter (HOSPITAL_BASED_OUTPATIENT_CLINIC_OR_DEPARTMENT_OTHER): Payer: Self-pay | Admitting: Family

## 2024-02-24 VITALS — BP 130/84 | HR 73 | Resp 17 | Ht 64.0 in | Wt 203.0 lb

## 2024-02-24 DIAGNOSIS — I10 Essential (primary) hypertension: Secondary | ICD-10-CM | POA: Diagnosis not present

## 2024-02-24 DIAGNOSIS — E785 Hyperlipidemia, unspecified: Secondary | ICD-10-CM | POA: Diagnosis not present

## 2024-02-24 DIAGNOSIS — R5383 Other fatigue: Secondary | ICD-10-CM

## 2024-02-24 DIAGNOSIS — G478 Other sleep disorders: Secondary | ICD-10-CM | POA: Diagnosis not present

## 2024-02-24 DIAGNOSIS — E6609 Other obesity due to excess calories: Secondary | ICD-10-CM

## 2024-02-24 DIAGNOSIS — D6859 Other primary thrombophilia: Secondary | ICD-10-CM

## 2024-02-24 DIAGNOSIS — E66811 Obesity, class 1: Secondary | ICD-10-CM

## 2024-02-24 DIAGNOSIS — Z6834 Body mass index (BMI) 34.0-34.9, adult: Secondary | ICD-10-CM

## 2024-02-24 DIAGNOSIS — R0683 Snoring: Secondary | ICD-10-CM

## 2024-02-24 MED ORDER — CLONIDINE 0.1 MG/24HR TD PTWK: 0.1000 mg | MEDICATED_PATCH | TRANSDERMAL | 12 refills | Status: AC

## 2024-02-24 NOTE — Progress Notes (Signed)
 Cardiology Office Note:  .   Date:  02/24/2024  ID:  Tracy Decker, DOB [birth year], MRN [id number] PCP: Cesario Mutton, MD  Nephrology: Dr. Alayne Slider [facility] Providers Cardiologist:  Shelda Bruckner, MD Cardiology APP:  Vannie Reche RAMAN, NP  Electrophysiologist:  Fonda Kitty, MD    History of Present Illness: .   Tracy Decker is a 58 y.o. female with history of PAF, nonobstructive CAD, hypertension, hyperaldosteronism, bilateral adrenal hyperplasia/adenoma s/p left adenectomy 2013 and persistent right adrenal hyperplasia/adenoma, chronic vertigo, generalized anxiety disorder, hyperlipidemia, migraine, insomnia.  Admitted 10/28 - 04/19/2023 with new onset A-fib with RVR.  She self converted to NSR.  She was discharged on pill in pocket strategy of.  Flecainide , Eliquis  5 mg twice daily.  At visit 05/08/24 she was started on Repatha . BP at goal and antihypertensive regimen continued. Seen 05/2023 by EP startedon Flecainide  50mg  BID. Subsequent ETT unremarkable. At visit 08/19/23 for BP control started on Chlorthalidone  25mg  daily.   Most recently evaluated by Lum Louis, NP in June and metoprolol  changed to carvedilol  due to elevated BP.   Presents today for follow up independently. 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 Recently returned from week in [place] visiting her sister. Notes daytime somnolence as well as snoring, no prior sleep study. She reports palpitations twice per day with no clear triggers, avoids caffeine. No chest pain, pressure, tightness. More difficulty with her asthma and dyspnea requiring Proventil BID. Good adherence to Singulair. She notes weight gain which she attributes to dietary habits. No LE edema, orthopnea, PND. BP at home on wrist cuff in 150s, not previously checked for accuracy. She inquires about Clonidine  patch as feels it better controlled her BP previously and led to lesser pill burden.   Previous  antihypertensive Lisinopril  - Switch to Valsartan  Amlodipine - gingival hypoplasia Metoprolol  - changed to Carvedilol   ROS: Please see the history of present illness.    All other systems reviewed and are negative.   Studies Reviewed: .        Cardiac Studies & Procedures   ______________________________________________________________________________________________   STRESS TESTS  EXERCISE TOLERANCE TEST (ETT) 06/28/2023  Interpretation Summary   No ST deviation was noted.   Normal ETT   HTN response to exercise   No significant arrhythmias with stress   ECHOCARDIOGRAM  ECHOCARDIOGRAM COMPLETE 04/18/2023  Narrative ECHOCARDIOGRAM REPORT    Patient Name:   Tracy Decker Date of Exam: 04/18/2023 Medical Rec #:  [id number]      Height:       64.0 in Accession #:    [id number]     Weight:       185.8 lb Date of Birth:  [birth year]      BSA:          1.896 m Patient Age:    57 years       BP:           133/77 mmHg Patient Gender: F              HR:           97 bpm. Exam Location:  Inpatient  Procedure: 2D Echo, Cardiac Doppler and Color Doppler  Indications:    Chest pain, Afib  History:        Patient has prior history of Echocardiogram examinations, most recent 07/15/2020. Arrythmias:Atrial Fibrillation; Risk Factors:Family History of Coronary Artery Disease.  Sonographer:    Ozell Free Referring Phys: [id number] SUBRINA SUNDIL  IMPRESSIONS   1.  Left ventricular ejection fraction, by estimation, is 65 to 70%. The left ventricle has normal function. The left ventricle has no regional wall motion abnormalities. There is moderate left ventricular hypertrophy. Left ventricular diastolic parameters are indeterminate. 2. Right ventricular systolic function is normal. The right ventricular size is normal. There is normal pulmonary artery systolic pressure. 3. Left atrial size was mildly dilated. 4. There is no evidence of cardiac tamponade. 5. The mitral valve is normal  in structure. Mild mitral valve regurgitation. 6. The aortic valve is normal in structure. Aortic valve regurgitation is not visualized. No aortic stenosis is present.  FINDINGS Left Ventricle: Left ventricular ejection fraction, by estimation, is 65 to 70%. The left ventricle has normal function. The left ventricle has no regional wall motion abnormalities. The left ventricular internal cavity size was normal in size. There is moderate left ventricular hypertrophy. Left ventricular diastolic parameters are indeterminate.  Right Ventricle: The right ventricular size is normal. No increase in right ventricular wall thickness. Right ventricular systolic function is normal. There is normal pulmonary artery systolic pressure. The tricuspid regurgitant velocity is 2.65 m/s, and with an assumed right atrial pressure of 3 mmHg, the estimated right ventricular systolic pressure is 31.1 mmHg.  Left Atrium: Left atrial size was mildly dilated.  Right Atrium: Right atrial size was normal in size.  Pericardium: Trivial pericardial effusion is present. There is no evidence of cardiac tamponade.  Mitral Valve: The mitral valve is normal in structure. Mild mitral valve regurgitation.  Tricuspid Valve: The tricuspid valve is normal in structure. Tricuspid valve regurgitation is mild.  Aortic Valve: The aortic valve is normal in structure. Aortic valve regurgitation is not visualized. No aortic stenosis is present. Aortic valve mean gradient measures 6.0 mmHg. Aortic valve peak gradient measures 11.5 mmHg. Aortic valve area, by VTI measures 2.79 cm.  Pulmonic Valve: The pulmonic valve was grossly normal. Pulmonic valve regurgitation is trivial.  Aorta: The aortic root and ascending aorta are structurally normal, with no evidence of dilitation.  IAS/Shunts: No atrial level shunt detected by color flow Doppler.   LEFT VENTRICLE PLAX 2D LVIDd:         3.60 cm   Diastology LVIDs:         1.90 cm   LV e'  medial:    6.72 cm/s LV PW:         1.40 cm   LV E/e' medial:  16.0 LV IVS:        1.50 cm   LV e' lateral:   9.24 cm/s LVOT diam:     1.80 cm   LV E/e' lateral: 11.6 LV SV:         77 LV SV Index:   41 LVOT Area:     2.54 cm   RIGHT VENTRICLE             IVC RV Basal diam:  3.40 cm     IVC diam: 1.50 cm RV S prime:     11.76 cm/s TAPSE (M-mode): 1.9 cm  LEFT ATRIUM             Index        RIGHT ATRIUM           Index LA diam:        4.50 cm 2.37 cm/m   RA Area:     13.90 cm LA Vol (A2C):   51.8 ml 27.32 ml/m  RA Volume:   37.00 ml  19.51 ml/m LA Vol (  A4C):   68.0 ml 35.86 ml/m LA Biplane Vol: 59.7 ml 31.48 ml/m AORTIC VALVE AV Area (Vmax):    2.99 cm AV Area (Vmean):   2.97 cm AV Area (VTI):     2.79 cm AV Vmax:           169.33 cm/s AV Vmean:          113.667 cm/s AV VTI:            0.276 m AV Peak Grad:      11.5 mmHg AV Mean Grad:      6.0 mmHg LVOT Vmax:         199.00 cm/s LVOT Vmean:        132.567 cm/s LVOT VTI:          0.302 m LVOT/AV VTI ratio: 1.10  AORTA Ao Root diam: 2.40 cm Ao Asc diam:  2.90 cm  MITRAL VALVE                TRICUSPID VALVE MV Area (PHT): 3.27 cm     TR Peak grad:   28.1 mmHg MV Decel Time: 232 msec     TR Vmax:        265.00 cm/s MV E velocity: 107.25 cm/s SHUNTS Systemic VTI:  0.30 m Systemic Diam: 1.80 cm  Morene Brownie Electronically signed by Morene Brownie Signature Date/Time: 04/18/2023/2:35:23 PM    Final    MONITORS  LONG TERM MONITOR (3-14 DAYS) 05/30/2023  Narrative Patch Wear Time:  13 days and 23 hours (2024-11-18T15:01:31-0500 to 2024-12-02T15:01:23-0500)  Patient had a min HR of 44 bpm, max HR of 145 bpm, and avg HR of 66 bpm. Predominant underlying rhythm was Sinus Rhythm. 1 run of nonsustained Ventricular Tachycardia occurred lasting 6 beats with a max rate of 130 bpm (avg 116 bpm). 3 Supraventricular Tachycardia runs occurred, the run with the fastest interval lasting 4 beats with a max rate of  145 bpm, the longest lasting 8 beats with an avg rate of 113 bpm. No atrial fibrillation, high degree block, or pauses noted. Isolated atrial and ventricular ectopy was rare (<1%). There were 37 patient triggered events. Most of these were normal sinus rhythm. Three events were sinus with single ectopic beats, one episode was associated with the 6 beats of NSVT noted above, and one was associated with possible junctional rhythm (rate 53 bpm).   CT SCANS  CT CORONARY MORPH W/CTA COR W/SCORE 07/16/2020  Addendum 07/17/2020  8:10 AM ADDENDUM REPORT: 07/17/2020 08:07  HISTORY: Chest pain/anginal equiv, ECGs or troponins abnormal  EXAM: Cardiac/Coronary CT  TECHNIQUE: The patient was scanned on a [employer].  PROTOCOL: A 120 kV prospective scan was triggered in the descending thoracic aorta at 111 HU's. Axial non-contrast 3 mm slices were carried out through the heart. The data set was analyzed on a dedicated work station and scored using the Agatson method. Gantry rotation speed was 250 msecs and collimation was 0.6 mm. Beta blockade and 0.8 mg of sl NTG was given. The 3D data set was reconstructed in 5% intervals of 35-75% of the R-R cycle. Diastolic phases were analyzed on a dedicated work station using MPR, MIP and VRT modes. The patient received 80mL OMNIPAQUE  IOHEXOL  350 MG/ML SOLN of contrast.  FINDINGS: Coronary calcium  score: The patient's coronary artery calcium  score is 3, which places the patient in the 83rd percentile.  Coronary arteries: Normal coronary origins.  Right dominance.  Right Coronary Artery: Normal caliber vessel, gives rise to  PDA. No significant plaque or stenosis.  Left Main Coronary Artery: Normal caliber vessel. No significant plaque or stenosis.  Left Anterior Descending Coronary Artery: Normal caliber vessel. Very small area of noncalcified plaque at the takeoff of D1, 1-24% stenosis. Gives rise to 2 diagonal branches without  significant plaque or stenosis. Distal LAD wraps apex.  Left Circumflex Artery: Normal caliber vessel. No significant plaque or stenosis. Gives rise to 2 large OM branches without significant plaque or stenosis.  Aorta: Normal size, mm at the mid ascending aorta (level of the PA bifurcation) measured double oblique. Trivial calcifications, consistent with aortic atherosclerosis. No dissection.  Aortic Valve: No calcifications. Trileaflet.  Other findings:  Normal pulmonary vein drainage into the left atrium.  Normal left atrial appendage without a thrombus.  Normal size of the pulmonary artery.  Likely small PFO, incidental finding.  IMPRESSION: 1.  Minimal nonobstructive CAD, CADRADS = 1.  2. Coronary calcium  score of 3. This was 83rd percentile for age and sex matched control.  3. Normal coronary origin with right dominance.  4.  Incidental very small PFO.  5.  Trivial aortic atherosclerosis.   Electronically Signed By: Shelda Bruckner M.D. On: 07/17/2020 08:07  Narrative EXAM: OVER-READ INTERPRETATION  CT CHEST  The following report is an over-read performed by radiologist Dr. Rockey Kilts of [facility] on 07/16/2020. This over-read does not include interpretation of cardiac or coronary anatomy or pathology. The coronary CTA interpretation by the cardiologist is attached.  COMPARISON:  07/15/2020 CTA chest.  FINDINGS: Vascular: Aortic atherosclerosis. No central pulmonary embolism, on this non-dedicated study.  Mediastinum/Nodes: No imaged thoracic adenopathy.  Lungs/Pleura: Trace left pleural thickening. The right lower lobe pleural-based opacity is only partially imaged and was detailed on yesterday's exam.  Upper Abdomen: Normal imaged portions of the liver, spleen.  Musculoskeletal: No acute osseous abnormality.  IMPRESSION: 1.  No acute findings in the imaged extracardiac chest. 2.  Aortic Atherosclerosis  (ICD10-I70.0).  Electronically Signed: By: Rockey Kilts M.D. On: 07/16/2020 13:23     ______________________________________________________________________________________________        Risk Assessment/Calculations:    CHA2DS2-VASc Score = 3   This indicates a 3.2% annual risk of stroke. The patient's score is based upon: CHF History: 0 HTN History: 1 Diabetes History: 0 Stroke History: 0 Vascular Disease History: 1 Age Score: 0 Gender Score: 1        STOP-Bang Score:  3      Physical Exam:   VS:  BP 130/84 (BP Location: Left Arm, Patient Position: Sitting, Cuff Size: Large)   Pulse 73   Resp 17   Ht 5' 4 (1.626 m)   Wt 203 lb (92.1 kg)   SpO2 98%   BMI 34.84 kg/m    Wt Readings from Last 3 Encounters:  02/24/24 203 lb (92.1 kg)  12/09/23 195 lb (88.5 kg)  09/30/23 193 lb (87.5 kg)    GEN: Well nourished, well developed in no acute distress NECK: No JVD; No carotid bruits CARDIAC: bradycardic, RRR, no murmurs, rubs, gallops RESPIRATORY:  Clear to auscultation without rales, wheezing or rhonchi  ABDOMEN: Soft, non-tender, non-distended EXTREMITIES:  No edema; No deformity   ASSESSMENT AND PLAN: .      Atrial Fibrillation / Hypercoagulable state / High risk medication use RRR by auscultation. Follows with Dr. Kennyth of EP. CHA2DS2-VASc Score = 3 [CHF History: 0, HTN History: 1, Diabetes History: 0, Stroke History: 0, Vascular Disease History: 1, Age Score: 0, Gender Score: 1].  Therefore, the patient's annual risk of stroke is 3.2 %.    Denies bleeding complications on Eliquis  5mg  BID.  Flecainide  monitoring: 11/25/23 K 4.8, 05/09/23 mag 1.8  Hyperlipidemia, LDL goal <70 Continue crestor  5mg  daily, Repatha  140mg /mL q2weeks. Myalgia on higher dose Crestor .  -Update CMET, lipid panel next week to assess lipids after PCSK9i  Hypertension Mildly elevated in clinic, also elevated at home.  -continue coreg  12.5mg  BID, chlorthalidone  25mg  daily, minoxidil 2.5mg   daily, valsartan  320mg  daily, spironolactone  50mg  daily -start clonidine  0.1mg  weekly patch as reports previously tolerated well and would like to reduce pill burden -MyChart message in 2 weeks to check in on BP. If BP controlled, consider discontinuation of Minoxidil. If BP not at goal, consider escalation of Clonidine  patch if tolerating well or escalation of Coreg . If tolerated Clonidine  patch well could increase dose to reduce oral agents.  Snoring / Daytime Somnolence STopBang 4. Plan for itamar watchpat.         Dispo: follow up in 6-8 weeks in person or virtual  Signed, Reche GORMAN Finder, NP

## 2024-02-24 NOTE — Patient Instructions (Addendum)
 Medication Instructions:   Start clonidine  0.1 mg/24 hr patch weekly--place 1 patch (0.1 mg total) onto the skin once a week  *If you need a refill on your cardiac medications before your next appointment, please call your pharmacy*   Lab Work:  NEXT WEEK AT [facility] 3RD FLOOR [address]--CBC, CMET, LIPID--PLEASE COME FASTING TO THIS LAB APPOINTMENT  If you have labs (blood work) drawn today and your tests are completely normal, you will receive your results only by: MyChart Message (if you have MyChart) OR A paper copy in the mail If you have any lab test that is abnormal or we need to change your treatment, we will call you to review the results.   Testing/Procedures:  Your physician has recommended that you have a ITAMAR sleep study. This test records several body functions during sleep, including: brain activity, eye movement, oxygen and carbon dioxide blood levels, heart rate and rhythm, breathing rate and rhythm, the flow of air through your mouth and nose, snoring, body muscle movements, and chest and belly movement.  THE COMPANY WILL BE MAILING YOU THIS DEVICE/INSTRUCTIONS IN THE MAIL, ONCE PRE-CERTIFICATION IS COMPLETED THROUGH YOUR INSURANCE COMPANY   Follow-Up:  Besan Ketchem, NP WILL SEND A MYCHART MESSAGE IN 2 WEEKS TO CHECK IN ON YOUR BLOOD PRESSURE READINGS THEN WILL SCHEDULE YOUR 6-8 WEEK FOLLOW-UP APPOINTMENT WITH HER AT THAT TIME

## 2024-02-29 ENCOUNTER — Encounter (HOSPITAL_BASED_OUTPATIENT_CLINIC_OR_DEPARTMENT_OTHER): Payer: Self-pay

## 2024-02-29 ENCOUNTER — Ambulatory Visit (HOSPITAL_BASED_OUTPATIENT_CLINIC_OR_DEPARTMENT_OTHER): Payer: Self-pay | Admitting: Family

## 2024-02-29 LAB — CBC
Hematocrit: 43.3 % (ref 34.0–46.6)
Hemoglobin: 14.2 g/dL (ref 11.1–15.9)
MCH: 32 pg (ref 26.6–33.0)
MCHC: 32.8 g/dL (ref 31.5–35.7)
MCV: 98 fL — ABNORMAL HIGH (ref 79–97)
Platelets: 333 x10E3/uL (ref 150–450)
RBC: 4.44 x10E6/uL (ref 3.77–5.28)
RDW: 13.2 % (ref 11.7–15.4)
WBC: 6.8 x10E3/uL (ref 3.4–10.8)

## 2024-02-29 LAB — LIPID PANEL
Chol/HDL Ratio: 2.4 ratio (ref 0.0–4.4)
Cholesterol, Total: 136 mg/dL (ref 100–199)
HDL: 56 mg/dL (ref >39)
LDL Chol Calc (NIH): 63 mg/dL (ref 0–99)
Triglycerides: 93 mg/dL (ref 0–149)
VLDL Cholesterol Cal: 17 mg/dL (ref 5–40)

## 2024-02-29 LAB — COMPREHENSIVE METABOLIC PANEL WITH GFR
ALT: 15 IU/L (ref 0–32)
AST: 10 IU/L (ref 0–40)
Albumin: 4.4 g/dL (ref 3.8–4.9)
Alkaline Phosphatase: 63 IU/L (ref 44–121)
BUN/Creatinine Ratio: 17 (ref 9–23)
BUN: 15 mg/dL (ref 6–24)
Bilirubin Total: 1 mg/dL (ref 0.0–1.2)
CO2: 19 mmol/L — ABNORMAL LOW (ref 20–29)
Calcium: 9.8 mg/dL (ref 8.7–10.2)
Chloride: 101 mmol/L (ref 96–106)
Creatinine, Ser: 0.89 mg/dL (ref 0.57–1.00)
Globulin, Total: 2.7 g/dL (ref 1.5–4.5)
Glucose: 93 mg/dL (ref 70–99)
Potassium: 4.6 mmol/L (ref 3.5–5.2)
Sodium: 137 mmol/L (ref 134–144)
Total Protein: 7.1 g/dL (ref 6.0–8.5)
eGFR: 76 mL/min/1.73 (ref >59)

## 2024-03-08 ENCOUNTER — Telehealth: Payer: Self-pay

## 2024-03-08 NOTE — Telephone Encounter (Signed)
   Pre-operative Risk Assessment    Patient Name: Neasia Fleeman  DOB: January 22, 1966 MRN: [id number]   Date of last office visit: 02/24/24 Date of next office visit: N/A   Request for Surgical Clearance    Procedure:  Colonoscopy   Date of Surgery:  Clearance TBD                                 Surgeon:  Dr. Ladora  Surgeon's Group or Practice Name:  [facility]  Phone number:  [phone] Fax number:  [phone]   Type of Clearance Requested:   - Medical  - Pharmacy:  Hold Apixaban  (Eliquis ) 2 days prior    Type of Anesthesia:  Not Indicated   Additional requests/questions:    Bonney Rebeca Blight   03/08/2024, 9:41 AM

## 2024-03-13 NOTE — Telephone Encounter (Signed)
Exercise tolerance >4 METS. Per AHA/ACC guidelines, she is deemed acceptable risk for the planned procedure without additional cardiovascular testing.  Alver Sorrow, NP

## 2024-03-13 NOTE — Telephone Encounter (Signed)
 Patient with diagnosis of afib on Eliquis  for anticoagulation.    Procedure: Colonoscopy  Date of procedure: TBD   CHA2DS2-VASc Score = 3   This indicates a 3.2% annual risk of stroke. The patient's score is based upon: CHF History: 0 HTN History: 1 Diabetes History: 0 Stroke History: 0 Vascular Disease History: 1 Age Score: 0 Gender Score: 1      CrCl 76.69 Platelet count 333  Patient has not had an Afib/aflutter ablation or Watchman within the last 3 months or DCCV within the last 30 days   Per office protocol, patient can hold Eliquis  for 2 days prior to procedure.    **This guidance is not considered finalized until pre-operative APP has relayed final recommendations.**

## 2024-03-13 NOTE — Telephone Encounter (Signed)
   Patient Name: Tracy Decker  DOB: 1966-03-11 MRN: [id number]  Primary Cardiologist: Shelda Bruckner, MD  Chart reviewed as part of pre-operative protocol coverage. Patient was recently seen by Reche Finder, NP, on 02/24/2024. Per Ms. Walker: Exercise tolerance >4 METS. Per AHA/ACC guidelines, she is deemed acceptable risk for the planned procedure without additional cardiovascular testing.  Per Pharmacy and office protocol, patient can hold Eliquis  for 2 days prior to procedure. Please resume as soon as safely possible afterwards.   I will route this recommendation to the requesting party via Epic fax function and remove from pre-op pool.  Please call with questions.  Tamorah Hada E Chelsei Mcchesney, PA-C 03/13/2024, 1:00 PM

## 2024-03-13 NOTE — Telephone Encounter (Signed)
 Hey Caitlin! You recently saw this patient in clinic on 02/24/2024. Are you able to comment on surgical clearance for upcoming colonoscopy (date TBD)? Please route your response to P CV DIV PREOP. Thank you!  ~Alisse Tuite

## 2024-03-26 ENCOUNTER — Encounter (HOSPITAL_BASED_OUTPATIENT_CLINIC_OR_DEPARTMENT_OTHER): Payer: Self-pay

## 2024-04-18 ENCOUNTER — Telehealth (HOSPITAL_BASED_OUTPATIENT_CLINIC_OR_DEPARTMENT_OTHER): Payer: Self-pay | Admitting: *Deleted

## 2024-04-18 NOTE — Telephone Encounter (Signed)
 Patient sent mychart message to follow up on Itamar sleep study   Will forward to sleep team for review

## 2024-04-19 NOTE — Telephone Encounter (Signed)
**Note De-Identified Tracy Decker Obfuscation** The pts WatchPAT One-HST was sent to the [name] on 10/24.  I have sent the pt a reply to [name] message advising her of this and asking her to verify her cell phone number as that is the way that the [employer] will communicate with her.

## 2024-04-22 ENCOUNTER — Encounter: Payer: Self-pay | Admitting: Cardiology

## 2024-04-22 DIAGNOSIS — I480 Paroxysmal atrial fibrillation: Secondary | ICD-10-CM | POA: Diagnosis not present

## 2024-05-01 ENCOUNTER — Ambulatory Visit: Attending: Family

## 2024-05-01 DIAGNOSIS — R5383 Other fatigue: Secondary | ICD-10-CM

## 2024-05-01 DIAGNOSIS — E66811 Obesity, class 1: Secondary | ICD-10-CM

## 2024-05-01 DIAGNOSIS — R0683 Snoring: Secondary | ICD-10-CM

## 2024-05-01 DIAGNOSIS — G478 Other sleep disorders: Secondary | ICD-10-CM

## 2024-05-01 NOTE — Procedures (Signed)
     SLEEP STUDY REPORT Patient Information Study Date: 04/22/2024 Patient Name: Tracy Decker Patient ID: [id number] Birth Date: 01-Dec-1965 Age: 58 Gender: Female BMI: 34.6 (W=203 lb, H=5' 4'') Referring Physician: Reche Finder, NP  TEST DESCRIPTION: Home sleep apnea testing was completed using the WatchPat, a Type 1 device, utilizing peripheral arterial tonometry (PAT), chest movement, actigraphy, pulse oximetry, pulse rate, body position and snore. AHI was calculated with apnea and hypopnea using valid sleep time as the denominator. RDI includes apneas, hypopneas, and RERAs. The data acquired and the scoring of sleep and all associated events were performed in accordance with the recommended standards and specifications as outlined in the AASM Manual for the Scoring of Sleep and Associated Events 2.2.0 (2015).  FINDINGS: 1. No evidence of Obstructive Sleep Apnea with AHI 0.8/hr. 2. No Central Sleep Apnea. 3. Oxygen desaturations as low as 90%. 4. Severe snoring was present. O2 sats were < 88% for 0 minutes. 5. Total sleep time was 6 hrs and 8 min. 6. 21.9% of total sleep time was spent in REM sleep. 7. Normal sleep onset latency at 22 min. 8. Prolonged REM sleep onset latency at 119 min. 9. Total awakenings were 7.  DIAGNOSIS: Normal study with no significant sleep disordered breathing.  RECOMMENDATIONS: 1. Normal study with no significant sleep disordered breathing. 2. Healthy sleep recommendations include: adequate nightly sleep (normal 7-9 hrs/night), avoidance of caffeine after noon and alcohol near bedtime, and maintaining a sleep environment that is cool, dark and quiet. 3. Weight loss for overweight patients is recommended. 4. Snoring recommendations include: weight loss where appropriate, side sleeping, and avoidance of alcohol before bed. 5. Operation of motor vehicle or dangerous equipment must be avoided when feeling drowsy, excessively sleepy, or mentally  fatigued. 6. An ENT consultation which may be useful for specific causes of and possible treatment of bothersome snoring . 7. Weight loss may be of benefit in reducing the severity of snoring.   Manual scoring / review was performed by Santana Chew Signature: Wilbert Bihari, MD; [facility]; Diplomat, American Board of Sleep Medicine Electronically Signed: 05/01/2024 2:25:01 PM

## 2024-05-07 ENCOUNTER — Telehealth: Payer: Self-pay | Admitting: *Deleted

## 2024-05-07 NOTE — Telephone Encounter (Signed)
-----   Message from Tracy Decker sent at 05/01/2024  2:28 PM EST ----- Please let patient know that sleep study showed no significant sleep apnea.

## 2024-05-07 NOTE — Telephone Encounter (Signed)
 The patient has been notified of the result via her mychart.

## 2024-05-17 ENCOUNTER — Other Ambulatory Visit (HOSPITAL_BASED_OUTPATIENT_CLINIC_OR_DEPARTMENT_OTHER): Payer: Self-pay | Admitting: Family

## 2024-05-17 DIAGNOSIS — I2510 Atherosclerotic heart disease of native coronary artery without angina pectoris: Secondary | ICD-10-CM

## 2024-05-17 DIAGNOSIS — E785 Hyperlipidemia, unspecified: Secondary | ICD-10-CM
